# Patient Record
Sex: Female | Born: 1955
Health system: Southern US, Community
[De-identification: ages and names within clinical notes are randomized; demographics above are authoritative.]

## PROBLEM LIST (undated history)

## (undated) DIAGNOSIS — B019 Varicella without complication: Secondary | ICD-10-CM

## (undated) DIAGNOSIS — F329 Major depressive disorder, single episode, unspecified: Secondary | ICD-10-CM

## (undated) DIAGNOSIS — R410 Disorientation, unspecified: Secondary | ICD-10-CM

## (undated) DIAGNOSIS — F32A Depression, unspecified: Secondary | ICD-10-CM

## (undated) HISTORY — DX: Varicella without complication: B01.9

## (undated) HISTORY — DX: Depression, unspecified: F32.A

## (undated) HISTORY — DX: Disorientation, unspecified: R41.0

## (undated) HISTORY — PX: OTHER SURGICAL HISTORY: SHX169

## (undated) HISTORY — DX: Major depressive disorder, single episode, unspecified: F32.9

---

## 2013-11-03 ENCOUNTER — Ambulatory Visit: Payer: Self-pay | Admitting: Physician Assistant

## 2013-11-06 ENCOUNTER — Other Ambulatory Visit: Payer: Self-pay | Admitting: Physician Assistant

## 2013-11-06 ENCOUNTER — Encounter: Payer: Self-pay | Admitting: Physician Assistant

## 2013-11-06 ENCOUNTER — Ambulatory Visit (INDEPENDENT_AMBULATORY_CARE_PROVIDER_SITE_OTHER): Payer: TRICARE For Life (TFL) | Admitting: Physician Assistant

## 2013-11-06 VITALS — BP 110/72 | HR 66 | Temp 97.9°F | Resp 14 | Ht 60.0 in | Wt 101.0 lb

## 2013-11-06 DIAGNOSIS — F0390 Unspecified dementia without behavioral disturbance: Secondary | ICD-10-CM

## 2013-11-06 DIAGNOSIS — F329 Major depressive disorder, single episode, unspecified: Secondary | ICD-10-CM

## 2013-11-06 DIAGNOSIS — F32A Depression, unspecified: Secondary | ICD-10-CM

## 2013-11-06 DIAGNOSIS — F3289 Other specified depressive episodes: Secondary | ICD-10-CM

## 2013-11-06 LAB — CBC
HEMATOCRIT: 37.4 % (ref 36.0–46.0)
HEMOGLOBIN: 12.6 g/dL (ref 12.0–15.0)
MCH: 31.1 pg (ref 26.0–34.0)
MCHC: 33.7 g/dL (ref 30.0–36.0)
MCV: 92.3 fL (ref 78.0–100.0)
Platelets: 254 10*3/uL (ref 150–400)
RBC: 4.05 MIL/uL (ref 3.87–5.11)
RDW: 12.8 % (ref 11.5–15.5)
WBC: 4.4 10*3/uL (ref 4.0–10.5)

## 2013-11-06 LAB — SEDIMENTATION RATE: Sed Rate: 9 mm/hr (ref 0–22)

## 2013-11-06 MED ORDER — ESCITALOPRAM OXALATE 20 MG PO TABS: ORAL_TABLET | ORAL | Status: DC

## 2013-11-06 NOTE — Progress Notes (Signed)
Pre visit review using our clinic review tool, if applicable. No additional management support is needed unless otherwise documented below in the visit note/SLS  
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
## 2013-11-06 NOTE — Patient Instructions (Signed)
It is unclear where your confusion is stemming from.  Please obtain labs.  Then stop by the front desk to have MRI scheduled.  You will also be contacted by a neurologist for evaluation.  I do think depression/anxiety is contributing to symptoms, but I do not feel it is the sole cause.  YOU ARE NOT TO DRIVE UNTIL CLEARED BY NEUROLOGY.  I will call you with all of your labs/imaging results. Please take Lexapro 10 mg (1/2 tablet) daily for 2 weeks, then increase to 20 mg daily (1 tablet)/

## 2013-11-07 LAB — TSH: TSH: 0.701 u[IU]/mL (ref 0.350–4.500)

## 2013-11-07 LAB — URINALYSIS, ROUTINE W REFLEX MICROSCOPIC
BILIRUBIN URINE: NEGATIVE
GLUCOSE, UA: NEGATIVE mg/dL
Hgb urine dipstick: NEGATIVE
Ketones, ur: NEGATIVE mg/dL
Leukocytes, UA: NEGATIVE
Nitrite: NEGATIVE
PH: 7.5 (ref 5.0–8.0)
Protein, ur: NEGATIVE mg/dL
SPECIFIC GRAVITY, URINE: 1.014 (ref 1.005–1.030)
Urobilinogen, UA: 1 mg/dL (ref 0.0–1.0)

## 2013-11-07 LAB — DRUG SCREEN, URINE
AMPHETAMINE SCRN UR: NEGATIVE
BENZODIAZEPINES.: NEGATIVE
Barbiturate Quant, Ur: NEGATIVE
Cocaine Metabolites: NEGATIVE
Creatinine,U: 87.89 mg/dL
Marijuana Metabolite: NEGATIVE
Methadone: NEGATIVE
Opiates: NEGATIVE
PHENCYCLIDINE (PCP): NEGATIVE
Propoxyphene: NEGATIVE

## 2013-11-07 LAB — HIV ANTIBODY (ROUTINE TESTING W REFLEX): HIV: NONREACTIVE

## 2013-11-07 LAB — FOLATE: Folate: 16.6 ng/mL

## 2013-11-07 LAB — RPR

## 2013-11-07 LAB — VITAMIN B12: Vitamin B-12: 585 pg/mL (ref 211–911)

## 2013-11-08 ENCOUNTER — Ambulatory Visit (INDEPENDENT_AMBULATORY_CARE_PROVIDER_SITE_OTHER)

## 2013-11-08 DIAGNOSIS — F0390 Unspecified dementia without behavioral disturbance: Secondary | ICD-10-CM

## 2013-11-08 DIAGNOSIS — G319 Degenerative disease of nervous system, unspecified: Secondary | ICD-10-CM

## 2013-11-13 ENCOUNTER — Ambulatory Visit (INDEPENDENT_AMBULATORY_CARE_PROVIDER_SITE_OTHER): Payer: TRICARE For Life (TFL) | Admitting: Neurology

## 2013-11-13 ENCOUNTER — Encounter: Payer: Self-pay | Admitting: Neurology

## 2013-11-13 VITALS — BP 102/68 | HR 66 | Temp 97.6°F | Resp 18 | Ht 61.0 in | Wt 100.8 lb

## 2013-11-13 DIAGNOSIS — F329 Major depressive disorder, single episode, unspecified: Secondary | ICD-10-CM

## 2013-11-13 DIAGNOSIS — F32A Depression, unspecified: Secondary | ICD-10-CM

## 2013-11-13 DIAGNOSIS — G309 Alzheimer's disease, unspecified: Principal | ICD-10-CM

## 2013-11-13 DIAGNOSIS — F0280 Dementia in other diseases classified elsewhere without behavioral disturbance: Secondary | ICD-10-CM

## 2013-11-13 DIAGNOSIS — F3289 Other specified depressive episodes: Secondary | ICD-10-CM

## 2013-11-13 MED ORDER — MEMANTINE HCL ER 7 & 14 & 21 &28 MG PO CP24: ORAL_CAPSULE | ORAL | Status: DC

## 2013-11-13 NOTE — Patient Instructions (Addendum)
1.  We will start memantine ER (Namenda XR) starter pack. Side effects include dizziness, headache, diarrhea or constipation.  Call with any questions or concerns. 2.  We will refer you to formal neuropsychological testing 3. Recommend following a set routine schedule (in regards to bedtime, waking up and meals). 4.  Stay active and social.  Go for walks with family members. 5.  We will provide you information with support groups and websites. 6.  Follow up in 6 months.
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
## 2013-11-14 DIAGNOSIS — F32A Depression, unspecified: Secondary | ICD-10-CM | POA: Insufficient documentation

## 2013-11-14 DIAGNOSIS — F0390 Unspecified dementia without behavioral disturbance: Secondary | ICD-10-CM | POA: Insufficient documentation

## 2013-11-14 DIAGNOSIS — F329 Major depressive disorder, single episode, unspecified: Secondary | ICD-10-CM | POA: Insufficient documentation

## 2013-11-14 NOTE — Assessment & Plan Note (Signed)
Will obtain Dementia Panel.  Will obtain MRI Brain w/o contrast. No driving.  Urgent referral placed to Neurology.

## 2013-11-14 NOTE — Assessment & Plan Note (Addendum)
Will start Lexapro 20 mg tablet -- 1/2 tablet daily x 2 weeks.  Then increase to 1 tablet daily. Follow-up in 1 month.

## 2013-11-16 ENCOUNTER — Telehealth: Payer: Self-pay | Admitting: *Deleted

## 2013-11-16 NOTE — Telephone Encounter (Signed)
Called from the pharmacy the titrated pack of Memantine is not available.

## 2013-11-20 ENCOUNTER — Telehealth: Payer: Self-pay | Admitting: Neurology

## 2013-11-20 NOTE — Telephone Encounter (Signed)
Pt needs talk someone about rx please call [phone]

## 2013-11-20 NOTE — Telephone Encounter (Signed)
Memantine HCl ER 7 & 14 & 21 &28 MG  1 daily with 1 refill : Start one 7mg  tab qd x7d, then one 14mg  tab qd x7d, then one 21mg  tab qd x7d, then one 28mg  tab qd [facility] [address]  I spoke with Manuela Schwartz to order they are no longer making the starter paks  . [facility] no longer carries the RX this way and was not covered by [employer] . Patient's brother was very upset with me about this stating he was a [employer] scientist and did not like that this was happening

## 2013-12-25 ENCOUNTER — Telehealth: Payer: Self-pay | Admitting: *Deleted

## 2013-12-25 NOTE — Telephone Encounter (Signed)
Adonis Brook from [facility] needs office note faxed [phone] for Ms. Allene Dillon

## 2014-01-10 ENCOUNTER — Telehealth: Payer: Self-pay | Admitting: Neurology

## 2014-01-10 ENCOUNTER — Other Ambulatory Visit: Payer: Self-pay | Admitting: *Deleted

## 2014-01-10 ENCOUNTER — Telehealth: Payer: Self-pay

## 2014-01-10 DIAGNOSIS — F32A Depression, unspecified: Secondary | ICD-10-CM

## 2014-01-10 DIAGNOSIS — F329 Major depressive disorder, single episode, unspecified: Secondary | ICD-10-CM

## 2014-01-10 MED ORDER — MEMANTINE HCL ER 7 & 14 & 21 &28 MG PO CP24: 28.0000 mg | ORAL_CAPSULE | Freq: Every day | ORAL | Status: DC

## 2014-01-10 MED ORDER — ESCITALOPRAM OXALATE 20 MG PO TABS: ORAL_TABLET | ORAL | Status: DC

## 2014-01-10 NOTE — Telephone Encounter (Signed)
Pt needs to have [facility] [address]

## 2014-01-10 NOTE — Telephone Encounter (Signed)
Rx sent to pharmacy. LDM 

## 2014-01-15 ENCOUNTER — Telehealth: Payer: Self-pay | Admitting: Neurology

## 2014-01-15 NOTE — Telephone Encounter (Signed)
Pt's brother came by the office requesting a refill for [facility] 28MG   [facility] [place]  C/B [phone]

## 2014-01-16 ENCOUNTER — Other Ambulatory Visit: Payer: Self-pay | Admitting: Physician Assistant

## 2014-01-16 ENCOUNTER — Other Ambulatory Visit: Payer: Self-pay | Admitting: *Deleted

## 2014-01-16 ENCOUNTER — Telehealth: Payer: Self-pay | Admitting: *Deleted

## 2014-01-16 DIAGNOSIS — F0280 Dementia in other diseases classified elsewhere without behavioral disturbance: Secondary | ICD-10-CM

## 2014-01-16 DIAGNOSIS — G309 Alzheimer's disease, unspecified: Principal | ICD-10-CM

## 2014-01-16 MED ORDER — MEMANTINE HCL-DONEPEZIL HCL ER 28-10 MG PO CP24: 28.0000 mg | ORAL_CAPSULE | Freq: Every day | ORAL | Status: DC

## 2014-01-16 NOTE — Telephone Encounter (Signed)
Namenda XR 28 mg called  To pharmacy # [id number] with 2 refills

## 2014-01-16 NOTE — Telephone Encounter (Signed)
Error

## 2014-01-23 ENCOUNTER — Other Ambulatory Visit: Payer: Self-pay | Admitting: *Deleted

## 2014-01-24 MED ORDER — MEMANTINE HCL ER 28 MG PO CP24: 28.0000 mg | ORAL_CAPSULE | Freq: Every day | ORAL | Status: DC

## 2014-03-01 ENCOUNTER — Telehealth: Payer: Self-pay | Admitting: Neurology

## 2014-03-01 NOTE — Telephone Encounter (Signed)
Requests for records received via mail from [facility]. Request forwarded to HIM at [facility] via interoffice mail for processing / Sherri S.

## 2014-03-06 ENCOUNTER — Telehealth: Payer: Self-pay | Admitting: *Deleted

## 2014-03-06 NOTE — Telephone Encounter (Signed)
Received medical record request via mail from [facility]. Forms forwarded to [facility]. JG//CMA

## 2014-03-16 ENCOUNTER — Telehealth: Payer: Self-pay | Admitting: Neurology

## 2014-03-16 NOTE — Telephone Encounter (Signed)
Received request by mail from [facility] requesting medical records. Request sent to HIM at [facility] via interoffice mail for processing / Sherri S.

## 2014-03-20 ENCOUNTER — Telehealth: Payer: Self-pay | Admitting: Physician Assistant

## 2014-03-20 MED ORDER — ESCITALOPRAM OXALATE 20 MG PO TABS: ORAL_TABLET | ORAL | Status: DC

## 2014-03-20 NOTE — Telephone Encounter (Signed)
Pt called and scheduled an appt for follow up

## 2014-03-20 NOTE — Telephone Encounter (Signed)
Ok to refill one month supply. She needs follow-up before additional refills will be given and patient was due for follow-up in August.

## 2014-03-20 NOTE — Telephone Encounter (Signed)
Left message for patient to return my call.

## 2014-03-20 NOTE — Telephone Encounter (Signed)
Caller name: Eather Colas Relation to pt: brother and POA Call back number: [phone] [facility] [address]: [phone]  Reason for call:   Patient brother states that patient is no longer using [facility] and would like a refill of escitalopram [facility] [address].

## 2014-03-20 NOTE — Telephone Encounter (Signed)
Please call pt and schedule follow-up appointment for further refills per provider instructions/SLS Thanks.

## 2014-03-26 ENCOUNTER — Ambulatory Visit (INDEPENDENT_AMBULATORY_CARE_PROVIDER_SITE_OTHER): Admitting: Physician Assistant

## 2014-03-26 ENCOUNTER — Encounter: Payer: Self-pay | Admitting: Physician Assistant

## 2014-03-26 VITALS — BP 128/81 | HR 68 | Temp 98.5°F | Resp 16 | Ht 61.0 in | Wt 114.0 lb

## 2014-03-26 DIAGNOSIS — F329 Major depressive disorder, single episode, unspecified: Secondary | ICD-10-CM

## 2014-03-26 DIAGNOSIS — F32A Depression, unspecified: Secondary | ICD-10-CM

## 2014-03-26 MED ORDER — ESCITALOPRAM OXALATE 20 MG PO TABS: ORAL_TABLET | ORAL | Status: DC

## 2014-03-26 NOTE — Assessment & Plan Note (Signed)
Doing well with current regimen.  No SI/HI.  Will continue current regimen.  Medications refilled. 90-day supply given with one refill.  Follow-up in 6 months.  Patient to continue follow-up with Neuropsychologist as well.  Return sooner if needed.  Recommended we perform CPE at time of follow-up.

## 2014-03-26 NOTE — Progress Notes (Signed)
    Patient presents to clinic today for follow-up of depression secondary to her dementia.  Is currently on Lexapro 20 mg daily.  Endorses taking medications as directed.  Patient and her brother both endorse improvement in mood with medication.  Denies depressed mood or anhedonia.  Endorses restful sleep as well with medication.  Is prescribed Namenda by her Neurologist with good improvement in cognition.  Has follow-up every 3 months at present.  Is also seeing a Neuropsychologist.   Past Medical History  Diagnosis Date  . Depression   . Acute confusion   . Chicken pox     Current Outpatient Prescriptions on File Prior to Visit  Medication Sig Dispense Refill  . Memantine HCl ER (NAMENDA XR) 28 MG CP24 Take 28 mg by mouth daily. 30 capsule 0   No current facility-administered medications on file prior to visit.    No Known Allergies  Family History  Problem Relation Age of Onset  . Asthma Sister   . Diabetes Father     Deceased  . Cancer Father 71    Multiple Myeloma  . Heart defect Sister   . Heart defect Mother     Deceased  . Heart defect Brother   . Migraines Other     Familial  . Heart attack Sister   . Healthy Brother     x4  . Healthy Sister     x2  . Alzheimer's disease Maternal Grandfather     Dementia  . Liver disease Maternal Uncle   . Healthy Son     x1    History   Social History  . Marital Status: Widowed    Spouse Name: N/A    Number of Children: N/A  . Years of Education: N/A   Social History Main Topics  . Smoking status: Never Smoker   . Smokeless tobacco: Never Used  . Alcohol Use: Yes     Comment: rare  . Drug Use: No  . Sexual Activity: No   Other Topics Concern  . None   Social History Narrative    Review of Systems - See HPI.  All other ROS are negative.  BP 128/81 mmHg  Pulse 68  Temp(Src) 98.5 F (36.9 C) (Oral)  Resp 16  Ht 5' 1" (1.549 m)  Wt 114 lb (51.71 kg)  BMI 21.55 kg/m2  SpO2 99%  Physical Exam    Constitutional: She is oriented to person, place, and time and well-developed, well-nourished, and in no distress.  HENT:  Head: Normocephalic and atraumatic.  Eyes: Conjunctivae are normal.  Neck: Neck supple.  Cardiovascular: Normal rate, regular rhythm, normal heart sounds and intact distal pulses.   Pulmonary/Chest: Effort normal and breath sounds normal. No respiratory distress. She has no wheezes. She has no rales. She exhibits no tenderness.  Neurological: She is alert and oriented to person, place, and time.  Skin: Skin is warm and dry. No rash noted.  Psychiatric: Affect normal.  Vitals reviewed.  Assessment/Plan: Depression Doing well with current regimen.  No SI/HI.  Will continue current regimen.  Medications refilled. 90-day supply given with one refill.  Follow-up in 6 months.  Patient to continue follow-up with Neuropsychologist as well.  Return sooner if needed.  Recommended we perform CPE at time of follow-up.      

## 2014-03-26 NOTE — Progress Notes (Signed)
Pre visit review using our clinic review tool, if applicable. No additional management support is needed unless otherwise documented below in the visit note/SLS  

## 2014-03-26 NOTE — Patient Instructions (Signed)
I am glad you are doing well. Please continue the Lexapro as directed daily.  Continue follow-up with Neurology as scheduled.  I want to see you in 6 months.  I recommend we do a physical at that time.  Return sooner as needed.  These intermittent headaches you have sound like migraines.  Read the information below on measures to take to help rid yourself of these.   Migraine Headache A migraine headache is very bad, throbbing pain on one or both sides of your head. Talk to your doctor about what things may bring on (trigger) your migraine headaches. HOME CARE  Only take medicines as told by your doctor.  Lie down in a dark, quiet room when you have a migraine.  Keep a journal to find out if certain things bring on migraine headaches. For example, write down:  What you eat and drink.  How much sleep you get.  Any change to your diet or medicines.  Lessen how much alcohol you drink.  Quit smoking if you smoke.  Get enough sleep.  Lessen any stress in your life.  Keep lights dim if bright lights bother you or make your migraines worse. GET HELP RIGHT AWAY IF:   Your migraine becomes really bad.  You have a fever.  You have a stiff neck.  You have trouble seeing.  Your muscles are weak, or you lose muscle control.  You lose your balance or have trouble walking.  You feel like you will pass out (faint), or you pass out.  You have really bad symptoms that are different than your first symptoms. MAKE SURE YOU:   Understand these instructions.  Will watch your condition.  Will get help right away if you are not doing well or get worse. Document Released: 02/11/2008 Document Revised: 07/27/2011 Document Reviewed: 01/09/2013 [facility] Patient Information 2015 [place], [place]. This information is not intended to replace advice given to you by your health care provider. Make sure you discuss any questions you have with your health care provider.

## 2014-04-27 ENCOUNTER — Ambulatory Visit: Payer: TRICARE For Life (TFL) | Admitting: Neurology

## 2014-05-21 ENCOUNTER — Other Ambulatory Visit: Payer: Self-pay | Admitting: Neurology

## 2014-05-28 ENCOUNTER — Encounter: Payer: Self-pay | Admitting: Neurology

## 2014-05-28 ENCOUNTER — Ambulatory Visit (INDEPENDENT_AMBULATORY_CARE_PROVIDER_SITE_OTHER): Admitting: Neurology

## 2014-05-28 VITALS — BP 106/68 | HR 78 | Temp 98.4°F | Resp 16 | Ht 61.0 in | Wt 118.6 lb

## 2014-05-28 DIAGNOSIS — R202 Paresthesia of skin: Secondary | ICD-10-CM

## 2014-05-28 DIAGNOSIS — R200 Anesthesia of skin: Secondary | ICD-10-CM

## 2014-05-28 DIAGNOSIS — G300 Alzheimer's disease with early onset: Secondary | ICD-10-CM

## 2014-05-28 DIAGNOSIS — F0280 Dementia in other diseases classified elsewhere without behavioral disturbance: Secondary | ICD-10-CM | POA: Insufficient documentation

## 2014-05-28 LAB — VITAMIN B12: Vitamin B-12: 743 pg/mL (ref 211–911)

## 2014-05-28 LAB — TSH: TSH: 0.828 u[IU]/mL (ref 0.350–4.500)

## 2014-05-28 NOTE — Patient Instructions (Signed)
1.  Continue memantine ER 28mg  daily 2.  To evaluate the numbness, we will get a nerve conduction study to look for neuropathy.   3.  We will also check B12 and TSH. 4.  We will refer you to the [facility] at [place].  Follow up after nerve conduction study.
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
## 2014-05-29 ENCOUNTER — Telehealth: Payer: Self-pay | Admitting: *Deleted

## 2014-05-29 NOTE — Telephone Encounter (Signed)
patient has an appt with Dr Brigitte Pulse at  [facility] 06/21/14 3:15 brother is aware

## 2014-06-12 ENCOUNTER — Telehealth: Payer: Self-pay | Admitting: Neurology

## 2014-06-12 NOTE — Telephone Encounter (Signed)
I have left several messages for patient for EMG since 05-29-14 and can not get anyone to call me back so at this time she does not have a appt for EMG or Follow up with you

## 2014-08-22 ENCOUNTER — Other Ambulatory Visit: Payer: Self-pay | Admitting: Physician Assistant

## 2014-09-04 ENCOUNTER — Telehealth: Payer: Self-pay | Admitting: Physician Assistant

## 2014-09-04 NOTE — Telephone Encounter (Signed)
Pre Visit letter sent  °

## 2014-09-20 ENCOUNTER — Other Ambulatory Visit: Payer: Self-pay | Admitting: Neurology

## 2014-09-20 NOTE — Telephone Encounter (Signed)
Rx sent 

## 2014-09-25 ENCOUNTER — Telehealth: Payer: Self-pay | Admitting: *Deleted

## 2014-09-25 NOTE — Telephone Encounter (Signed)
Unable to reach patient at time of Pre-Visit Call.  Left message for patient to return call when available.    

## 2014-09-26 ENCOUNTER — Encounter: Payer: Self-pay | Admitting: Physician Assistant

## 2014-09-26 ENCOUNTER — Ambulatory Visit (INDEPENDENT_AMBULATORY_CARE_PROVIDER_SITE_OTHER): Admitting: Physician Assistant

## 2014-09-26 ENCOUNTER — Other Ambulatory Visit (INDEPENDENT_AMBULATORY_CARE_PROVIDER_SITE_OTHER)

## 2014-09-26 ENCOUNTER — Other Ambulatory Visit (HOSPITAL_COMMUNITY)
Admission: RE | Admit: 2014-09-26 | Discharge: 2014-09-26 | Disposition: A | Discharge status: Home / Self Care | Source: Ambulatory Visit | Attending: Family Medicine | Admitting: Family Medicine

## 2014-09-26 VITALS — BP 122/82 | HR 69 | Temp 98.0°F | Wt 114.0 lb

## 2014-09-26 DIAGNOSIS — G300 Alzheimer's disease with early onset: Secondary | ICD-10-CM

## 2014-09-26 DIAGNOSIS — Z124 Encounter for screening for malignant neoplasm of cervix: Secondary | ICD-10-CM | POA: Diagnosis not present

## 2014-09-26 DIAGNOSIS — Z0000 Encounter for general adult medical examination without abnormal findings: Secondary | ICD-10-CM | POA: Diagnosis not present

## 2014-09-26 DIAGNOSIS — Z01419 Encounter for gynecological examination (general) (routine) without abnormal findings: Secondary | ICD-10-CM | POA: Insufficient documentation

## 2014-09-26 DIAGNOSIS — F0280 Dementia in other diseases classified elsewhere without behavioral disturbance: Secondary | ICD-10-CM

## 2014-09-26 DIAGNOSIS — L609 Nail disorder, unspecified: Secondary | ICD-10-CM

## 2014-09-26 DIAGNOSIS — Z1239 Encounter for other screening for malignant neoplasm of breast: Secondary | ICD-10-CM | POA: Diagnosis not present

## 2014-09-26 DIAGNOSIS — R937 Abnormal findings on diagnostic imaging of other parts of musculoskeletal system: Secondary | ICD-10-CM

## 2014-09-26 LAB — URINALYSIS, ROUTINE W REFLEX MICROSCOPIC
BILIRUBIN URINE: NEGATIVE
Hgb urine dipstick: NEGATIVE
Ketones, ur: NEGATIVE
LEUKOCYTES UA: NEGATIVE
Nitrite: NEGATIVE
PH: 7.5 (ref 5.0–8.0)
RBC / HPF: NONE SEEN (ref 0–?)
Specific Gravity, Urine: 1.015 (ref 1.000–1.030)
Total Protein, Urine: NEGATIVE
URINE GLUCOSE: NEGATIVE
Urobilinogen, UA: 1 (ref 0.0–1.0)

## 2014-09-26 LAB — HEMOGLOBIN A1C: HEMOGLOBIN A1C: 5 % (ref 4.6–6.5)

## 2014-09-26 LAB — BASIC METABOLIC PANEL
BUN: 13 mg/dL (ref 6–23)
CALCIUM: 10.1 mg/dL (ref 8.4–10.5)
CO2: 32 mEq/L (ref 19–32)
Chloride: 100 mEq/L (ref 96–112)
Creatinine, Ser: 0.59 mg/dL (ref 0.40–1.20)
GFR: 111.09 mL/min (ref >60.00)
Glucose, Bld: 105 mg/dL — ABNORMAL HIGH (ref 70–99)
Potassium: 3.3 mEq/L — ABNORMAL LOW (ref 3.5–5.1)
SODIUM: 137 meq/L (ref 135–145)

## 2014-09-26 LAB — HEPATIC FUNCTION PANEL
ALK PHOS: 63 U/L (ref 39–117)
ALT: 25 U/L (ref 0–35)
AST: 21 U/L (ref 0–37)
Albumin: 4.5 g/dL (ref 3.5–5.2)
Bilirubin, Direct: 0.1 mg/dL (ref 0.0–0.3)
Total Bilirubin: 0.9 mg/dL (ref 0.2–1.2)
Total Protein: 8.1 g/dL (ref 6.0–8.3)

## 2014-09-26 LAB — LIPID PANEL
CHOLESTEROL: 269 mg/dL — AB (ref 0–200)
HDL: 52.2 mg/dL (ref >39.00)
LDL Cholesterol: 191 mg/dL — ABNORMAL HIGH (ref 0–99)
NonHDL: 216.8
Total CHOL/HDL Ratio: 5
Triglycerides: 129 mg/dL (ref 0.0–149.0)
VLDL: 25.8 mg/dL (ref 0.0–40.0)

## 2014-09-26 LAB — CBC
HCT: 40.2 % (ref 36.0–46.0)
Hemoglobin: 13.5 g/dL (ref 12.0–15.0)
MCHC: 33.5 g/dL (ref 30.0–36.0)
MCV: 91 fl (ref 78.0–100.0)
PLATELETS: 241 10*3/uL (ref 150.0–400.0)
RBC: 4.41 Mil/uL (ref 3.87–5.11)
RDW: 12.7 % (ref 11.5–15.5)
WBC: 4.7 10*3/uL (ref 4.0–10.5)

## 2014-09-26 LAB — VITAMIN B12: Vitamin B-12: 463 pg/mL (ref 211–911)

## 2014-09-26 LAB — TSH: TSH: 0.62 u[IU]/mL (ref 0.35–4.50)

## 2014-09-26 NOTE — Progress Notes (Signed)
Pre visit review using our clinic review tool, if applicable. No additional management support is needed unless otherwise documented below in the visit note. 

## 2014-09-26 NOTE — Assessment & Plan Note (Addendum)
Order placed for screening bilateral mammogram.

## 2014-09-26 NOTE — Assessment & Plan Note (Signed)
Preventative care discussed. Patient overdue for mammogram, colonoscopy and Pap smear. Pap performed today. Order placed for mammogram. Will place referral to GI for screening colonoscopy. Will obtain fasting labs today to include CBC, BMP, LFTs, TSH, UA, lipid panel. Handout given to patient in the AVS.

## 2014-09-26 NOTE — Progress Notes (Signed)
Patient presents to clinic today for annual exam.  Patient is fasting for labs.  Acute Concerns: Patient requesting evaluation of her toes. Endorses discoloration of toenails over the past several months. Patient does endorse history of onychomycosis.  Chronic Issues: Alzheimer's Dementia -- patient currently followed by neurology. Is on Aricept and Namenda daily. Endorses stability and cognition. States she is remembering things much better. Brother is with the patient today. He denies any acute concerns regarding dementia.   Depression -- Mood doing well overall. Patient endorses taking her Lexapro daily as directed without side effect. Denies suicidal thought or ideation. Endorses being very happy.  Health Maintenance: Dental -- Dentures Vision -- Up-to-date Immunizations -- Due.  Will be getting today. Colonoscopy -- Has never had. Average risk. Will place referral.  Mammogram -- Overdue. No hx of abnormal mammogram. PAP -- Overdue. No hx of abnormal PAP per patient.  Is not currently followed by Gynecology.  Past Medical History  Diagnosis Date  . Depression   . Acute confusion   . Chicken pox     Past Surgical History  Procedure Laterality Date  . Unremarkable.      Current Outpatient Prescriptions on File Prior to Visit  Medication Sig Dispense Refill  . Cinnamon 500 MG capsule Take 500 mg by mouth daily.    Marland Kitchen escitalopram (LEXAPRO) 20 MG tablet TAKE 1 TABLET BY MOUTH EVERY DAY 90 tablet 0  . Multiple Vitamin (MULTIVITAMIN) tablet Take 1 tablet by mouth daily.    Marland Kitchen NAMENDA XR 28 MG CP24 24 hr capsule TAKE ONE CAPSULE BY MOUTH EVERY DAY 30 capsule 3   No current facility-administered medications on file prior to visit.    No Known Allergies  Family History  Problem Relation Age of Onset  . Asthma Sister   . Diabetes Father     Deceased  . Cancer Father 44    Multiple Myeloma  . Heart defect Sister   . Heart defect Mother     Deceased  . Heart defect  Brother   . Migraines Other     Familial  . Heart attack Sister   . Healthy Brother     x4  . Healthy Sister     x2  . Alzheimer's disease Maternal Grandfather     Dementia  . Liver disease Maternal Uncle   . Healthy Son     x1    History   Social History  . Marital Status: Widowed    Spouse Name: N/A  . Number of Children: N/A  . Years of Education: N/A   Occupational History  . Not on file.   Social History Main Topics  . Smoking status: Never Smoker   . Smokeless tobacco: Never Used  . Alcohol Use: Yes     Comment: rare  . Drug Use: No  . Sexual Activity: No   Other Topics Concern  . Not on file   Social History Narrative   Review of Systems  Constitutional: Negative for fever and weight loss.  HENT: Negative for ear discharge, ear pain, hearing loss and tinnitus.   Eyes: Negative for blurred vision, double vision, photophobia and pain.  Respiratory: Negative for cough and shortness of breath.   Cardiovascular: Negative for chest pain and palpitations.  Gastrointestinal: Negative for heartburn, nausea, vomiting, abdominal pain, diarrhea, constipation, blood in stool and melena.  Genitourinary: Negative for dysuria, urgency, frequency, hematuria and flank pain.  Musculoskeletal: Negative for falls.  Neurological: Negative for dizziness, loss  of consciousness and headaches.  Endo/Heme/Allergies: Negative for environmental allergies.  Psychiatric/Behavioral: Negative for depression, suicidal ideas, hallucinations and substance abuse. The patient is not nervous/anxious and does not have insomnia.    BP 122/82 mmHg  Pulse 69  Temp(Src) 98 F (36.7 C)  Wt 114 lb (51.71 kg)  SpO2 98%  Physical Exam  Constitutional: She is oriented to person, place, and time and well-developed, well-nourished, and in no distress.  HENT:  Head: Normocephalic and atraumatic.  Right Ear: Tympanic membrane, external ear and ear canal normal.  Left Ear: Tympanic membrane,  external ear and ear canal normal.  Nose: Nose normal. No mucosal edema.  Mouth/Throat: Uvula is midline, oropharynx is clear and moist and mucous membranes are normal. No oropharyngeal exudate or posterior oropharyngeal erythema.  Eyes: Conjunctivae are normal. Pupils are equal, round, and reactive to light.  Neck: Neck supple. No thyromegaly present.  Cardiovascular: Normal rate, regular rhythm, normal heart sounds and intact distal pulses.   Pulmonary/Chest: Effort normal and breath sounds normal. No respiratory distress. She has no wheezes. She has no rales.  Abdominal: Soft. Bowel sounds are normal. She exhibits no distension and no mass. There is no tenderness. There is no rebound and no guarding.  Genitourinary: Vagina normal, uterus normal, cervix normal, right adnexa normal and left adnexa normal. No vaginal discharge found.  Musculoskeletal: Normal range of motion.  Lymphadenopathy:    She has no cervical adenopathy.  Neurological: She is alert and oriented to person, place, and time. No cranial nerve deficit.  Skin: Skin is warm and dry. No rash noted.  Psychiatric: Affect normal.  Vitals reviewed.  No results found for this or any previous visit (from the past 2160 hour(s)).  Assessment/Plan: Pap smear for cervical cancer screening Examination within normal limits. Pap obtained and sent to lab for cytology.   Breast cancer screening Order placed for screening bilateral mammogram.   Alzheimer's disease, early onset Followed by neurology. Well-controlled. Continue current regimen. Will check B12 level, as low B12 can exacerbate memory issues   Visit for preventive health examination Preventative care discussed. Patient overdue for mammogram, colonoscopy and Pap smear. Pap performed today. Order placed for mammogram. Will place referral to GI for screening colonoscopy. Will obtain fasting labs today to include CBC, BMP, LFTs, TSH, UA, lipid panel. Handout given to patient in  the AVS.

## 2014-09-26 NOTE — Assessment & Plan Note (Signed)
Followed by neurology. Well-controlled. Continue current regimen. Will check B12 level, as low B12 can exacerbate memory issues

## 2014-09-26 NOTE — Patient Instructions (Signed)
Please go to the lab for blood work. I will call you with your results.  You will be contacted to schedule a mammogram. You will also be contacted by Podiatry for an appointment. Follow-up with Neurology as scheduled.  Preventive Care for Adults A healthy lifestyle and preventive care can promote health and wellness. Preventive health guidelines for women include the following key practices.  A routine yearly physical is a good way to check with your health care provider about your health and preventive screening. It is a chance to share any concerns and updates on your health and to receive a thorough exam.  Visit your dentist for a routine exam and preventive care every 6 months. Brush your teeth twice a day and floss once a day. Good oral hygiene prevents tooth decay and gum disease.  The frequency of eye exams is based on your age, health, family medical history, use of contact lenses, and other factors. Follow your health care provider's recommendations for frequency of eye exams.  Eat a healthy diet. Foods like vegetables, fruits, whole grains, low-fat dairy products, and lean protein foods contain the nutrients you need without too many calories. Decrease your intake of foods high in solid fats, added sugars, and salt. Eat the right amount of calories for you.Get information about a proper diet from your health care provider, if necessary.  Regular physical exercise is one of the most important things you can do for your health. Most adults should get at least 150 minutes of moderate-intensity exercise (any activity that increases your heart rate and causes you to sweat) each week. In addition, most adults need muscle-strengthening exercises on 2 or more days a week.  Maintain a healthy weight. The body mass index (BMI) is a screening tool to identify possible weight problems. It provides an estimate of body fat based on height and weight. Your health care provider can find your BMI and  can help you achieve or maintain a healthy weight.For adults 20 years and older:  A BMI below 18.5 is considered underweight.  A BMI of 18.5 to 24.9 is normal.  A BMI of 25 to 29.9 is considered overweight.  A BMI of 30 and above is considered obese.  Maintain normal blood lipids and cholesterol levels by exercising and minimizing your intake of saturated fat. Eat a balanced diet with plenty of fruit and vegetables. Blood tests for lipids and cholesterol should begin at age 53 and be repeated every 5 years. If your lipid or cholesterol levels are high, you are over 50, or you are at high risk for heart disease, you may need your cholesterol levels checked more frequently.Ongoing high lipid and cholesterol levels should be treated with medicines if diet and exercise are not working.  If you smoke, find out from your health care provider how to quit. If you do not use tobacco, do not start.  Lung cancer screening is recommended for adults aged 109-80 years who are at high risk for developing lung cancer because of a history of smoking. A yearly low-dose CT scan of the lungs is recommended for people who have at least a 30-pack-year history of smoking and are a current smoker or have quit within the past 15 years. A pack year of smoking is smoking an average of 1 pack of cigarettes a day for 1 year (for example: 1 pack a day for 30 years or 2 packs a day for 15 years). Yearly screening should continue until the smoker has stopped  smoking for at least 15 years. Yearly screening should be stopped for people who develop a health problem that would prevent them from having lung cancer treatment.  If you are pregnant, do not drink alcohol. If you are breastfeeding, be very cautious about drinking alcohol. If you are not pregnant and choose to drink alcohol, do not have more than 1 drink per day. One drink is considered to be 12 ounces (355 mL) of beer, 5 ounces (148 mL) of wine, or 1.5 ounces (44 mL) of  liquor.  Avoid use of street drugs. Do not share needles with anyone. Ask for help if you need support or instructions about stopping the use of drugs.  High blood pressure causes heart disease and increases the risk of stroke. Your blood pressure should be checked at least every 1 to 2 years. Ongoing high blood pressure should be treated with medicines if weight loss and exercise do not work.  If you are 41-19 years old, ask your health care provider if you should take aspirin to prevent strokes.  Diabetes screening involves taking a blood sample to check your fasting blood sugar level. This should be done once every 3 years, after age 54, if you are within normal weight and without risk factors for diabetes. Testing should be considered at a younger age or be carried out more frequently if you are overweight and have at least 1 risk factor for diabetes.  Breast cancer screening is essential preventive care for women. You should practice "breast self-awareness." This means understanding the normal appearance and feel of your breasts and may include breast self-examination. Any changes detected, no matter how small, should be reported to a health care provider. Women in their 77s and 30s should have a clinical breast exam (CBE) by a health care provider as part of a regular health exam every 1 to 3 years. After age 16, women should have a CBE every year. Starting at age 69, women should consider having a mammogram (breast X-ray test) every year. Women who have a family history of breast cancer should talk to their health care provider about genetic screening. Women at a high risk of breast cancer should talk to their health care providers about having an MRI and a mammogram every year.  Breast cancer gene (BRCA)-related cancer risk assessment is recommended for women who have family members with BRCA-related cancers. BRCA-related cancers include breast, ovarian, tubal, and peritoneal cancers. Having  family members with these cancers may be associated with an increased risk for harmful changes (mutations) in the breast cancer genes BRCA1 and BRCA2. Results of the assessment will determine the need for genetic counseling and BRCA1 and BRCA2 testing.  Routine pelvic exams to screen for cancer are no longer recommended for nonpregnant women who are considered low risk for cancer of the pelvic organs (ovaries, uterus, and vagina) and who do not have symptoms. Ask your health care provider if a screening pelvic exam is right for you.  If you have had past treatment for cervical cancer or a condition that could lead to cancer, you need Pap tests and screening for cancer for at least 20 years after your treatment. If Pap tests have been discontinued, your risk factors (such as having a new sexual partner) need to be reassessed to determine if screening should be resumed. Some women have medical problems that increase the chance of getting cervical cancer. In these cases, your health care provider may recommend more frequent screening and Pap tests.  The HPV test is an additional test that may be used for cervical cancer screening. The HPV test looks for the virus that can cause the cell changes on the cervix. The cells collected during the Pap test can be tested for HPV. The HPV test could be used to screen women aged 71 years and older, and should be used in women of any age who have unclear Pap test results. After the age of 62, women should have HPV testing at the same frequency as a Pap test.  Colorectal cancer can be detected and often prevented. Most routine colorectal cancer screening begins at the age of 4 years and continues through age 6 years. However, your health care provider may recommend screening at an earlier age if you have risk factors for colon cancer. On a yearly basis, your health care provider may provide home test kits to check for hidden blood in the stool. Use of a small camera at  the end of a tube, to directly examine the colon (sigmoidoscopy or colonoscopy), can detect the earliest forms of colorectal cancer. Talk to your health care provider about this at age 32, when routine screening begins. Direct exam of the colon should be repeated every 5-10 years through age 21 years, unless early forms of pre-cancerous polyps or small growths are found.  People who are at an increased risk for hepatitis B should be screened for this virus. You are considered at high risk for hepatitis B if:  You were born in a country where hepatitis B occurs often. Talk with your health care provider about which countries are considered high risk.  Your parents were born in a high-risk country and you have not received a shot to protect against hepatitis B (hepatitis B vaccine).  You have HIV or AIDS.  You use needles to inject street drugs.  You live with, or have sex with, someone who has hepatitis B.  You get hemodialysis treatment.  You take certain medicines for conditions like cancer, organ transplantation, and autoimmune conditions.  Hepatitis C blood testing is recommended for all people born from [birth year] through 1965 and any individual with known risks for hepatitis C.  Practice safe sex. Use condoms and avoid high-risk sexual practices to reduce the spread of sexually transmitted infections (STIs). STIs include gonorrhea, chlamydia, syphilis, trichomonas, herpes, HPV, and human immunodeficiency virus (HIV). Herpes, HIV, and HPV are viral illnesses that have no cure. They can result in disability, cancer, and death.  You should be screened for sexually transmitted illnesses (STIs) including gonorrhea and chlamydia if:  You are sexually active and are younger than 24 years.  You are older than 24 years and your health care provider tells you that you are at risk for this type of infection.  Your sexual activity has changed since you were last screened and you are at an increased  risk for chlamydia or gonorrhea. Ask your health care provider if you are at risk.  If you are at risk of being infected with HIV, it is recommended that you take a prescription medicine daily to prevent HIV infection. This is called preexposure prophylaxis (PrEP). You are considered at risk if:  You are a heterosexual woman, are sexually active, and are at increased risk for HIV infection.  You take drugs by injection.  You are sexually active with a partner who has HIV.  Talk with your health care provider about whether you are at high risk of being infected with HIV. If you  choose to begin PrEP, you should first be tested for HIV. You should then be tested every 3 months for as long as you are taking PrEP.  Osteoporosis is a disease in which the bones lose minerals and strength with aging. This can result in serious bone fractures or breaks. The risk of osteoporosis can be identified using a bone density scan. Women ages 48 years and over and women at risk for fractures or osteoporosis should discuss screening with their health care providers. Ask your health care provider whether you should take a calcium supplement or vitamin D to reduce the rate of osteoporosis.  Menopause can be associated with physical symptoms and risks. Hormone replacement therapy is available to decrease symptoms and risks. You should talk to your health care provider about whether hormone replacement therapy is right for you.  Use sunscreen. Apply sunscreen liberally and repeatedly throughout the day. You should seek shade when your shadow is shorter than you. Protect yourself by wearing long sleeves, pants, a wide-brimmed hat, and sunglasses year round, whenever you are outdoors.  Once a month, do a whole body skin exam, using a mirror to look at the skin on your back. Tell your health care provider of new moles, moles that have irregular borders, moles that are larger than a pencil eraser, or moles that have changed  in shape or color.  Stay current with required vaccines (immunizations).  Influenza vaccine. All adults should be immunized every year.  Tetanus, diphtheria, and acellular pertussis (Td, Tdap) vaccine. Pregnant women should receive 1 dose of Tdap vaccine during each pregnancy. The dose should be obtained regardless of the length of time since the last dose. Immunization is preferred during the 27th-36th week of gestation. An adult who has not previously received Tdap or who does not know her vaccine status should receive 1 dose of Tdap. This initial dose should be followed by tetanus and diphtheria toxoids (Td) booster doses every 10 years. Adults with an unknown or incomplete history of completing a 3-dose immunization series with Td-containing vaccines should begin or complete a primary immunization series including a Tdap dose. Adults should receive a Td booster every 10 years.  Varicella vaccine. An adult without evidence of immunity to varicella should receive 2 doses or a second dose if she has previously received 1 dose. Pregnant females who do not have evidence of immunity should receive the first dose after pregnancy. This first dose should be obtained before leaving the health care facility. The second dose should be obtained 4-8 weeks after the first dose.  Human papillomavirus (HPV) vaccine. Females aged 13-26 years who have not received the vaccine previously should obtain the 3-dose series. The vaccine is not recommended for use in pregnant females. However, pregnancy testing is not needed before receiving a dose. If a female is found to be pregnant after receiving a dose, no treatment is needed. In that [place], the remaining doses should be delayed until after the pregnancy. Immunization is recommended for any person with an immunocompromised condition through the age of 25 years if she did not get any or all doses earlier. During the 3-dose series, the second dose should be obtained 4-8 weeks  after the first dose. The third dose should be obtained 24 weeks after the first dose and 16 weeks after the second dose.  Zoster vaccine. One dose is recommended for adults aged 76 years or older unless certain conditions are present.  Measles, mumps, and rubella (MMR) vaccine. Adults born before  1957 generally are considered immune to measles and mumps. Adults born in [birth year] or later should have 1 or more doses of MMR vaccine unless there is a contraindication to the vaccine or there is laboratory evidence of immunity to each of the three diseases. A routine second dose of MMR vaccine should be obtained at least 28 days after the first dose for students attending postsecondary schools, health care workers, or international travelers. People who received inactivated measles vaccine or an unknown type of measles vaccine during 1963-1967 should receive 2 doses of MMR vaccine. People who received inactivated mumps vaccine or an unknown type of mumps vaccine before 1979 and are at high risk for mumps infection should consider immunization with 2 doses of MMR vaccine. For females of childbearing age, rubella immunity should be determined. If there is no evidence of immunity, females who are not pregnant should be vaccinated. If there is no evidence of immunity, females who are pregnant should delay immunization until after pregnancy. Unvaccinated health care workers born before [birth year] who lack laboratory evidence of measles, mumps, or rubella immunity or laboratory confirmation of disease should consider measles and mumps immunization with 2 doses of MMR vaccine or rubella immunization with 1 dose of MMR vaccine.  Pneumococcal 13-valent conjugate (PCV13) vaccine. When indicated, a person who is uncertain of her immunization history and has no record of immunization should receive the PCV13 vaccine. An adult aged 33 years or older who has certain medical conditions and has not been previously immunized should receive 1  dose of PCV13 vaccine. This PCV13 should be followed with a dose of pneumococcal polysaccharide (PPSV23) vaccine. The PPSV23 vaccine dose should be obtained at least 8 weeks after the dose of PCV13 vaccine. An adult aged 26 years or older who has certain medical conditions and previously received 1 or more doses of PPSV23 vaccine should receive 1 dose of PCV13. The PCV13 vaccine dose should be obtained 1 or more years after the last PPSV23 vaccine dose.  Pneumococcal polysaccharide (PPSV23) vaccine. When PCV13 is also indicated, PCV13 should be obtained first. All adults aged 91 years and older should be immunized. An adult younger than age 51 years who has certain medical conditions should be immunized. Any person who resides in a nursing home or long-term care facility should be immunized. An adult smoker should be immunized. People with an immunocompromised condition and certain other conditions should receive both PCV13 and PPSV23 vaccines. People with human immunodeficiency virus (HIV) infection should be immunized as soon as possible after diagnosis. Immunization during chemotherapy or radiation therapy should be avoided. Routine use of PPSV23 vaccine is not recommended for American Indians, [place] Natives, or people younger than 65 years unless there are medical conditions that require PPSV23 vaccine. When indicated, people who have unknown immunization and have no record of immunization should receive PPSV23 vaccine. One-time revaccination 5 years after the first dose of PPSV23 is recommended for people aged 19-64 years who have chronic kidney failure, nephrotic syndrome, asplenia, or immunocompromised conditions. People who received 1-2 doses of PPSV23 before age 38 years should receive another dose of PPSV23 vaccine at age 47 years or later if at least 5 years have passed since the previous dose. Doses of PPSV23 are not needed for people immunized with PPSV23 at or after age 75 years.  Meningococcal  vaccine. Adults with asplenia or persistent complement component deficiencies should receive 2 doses of quadrivalent meningococcal conjugate (MenACWY-D) vaccine. The doses should be obtained at least 2 months  apart. Microbiologists working with certain meningococcal bacteria, [employer] recruits, people at risk during an outbreak, and people who travel to or live in countries with a high rate of meningitis should be immunized. A first-year college student up through age 13 years who is living in a residence hall should receive a dose if she did not receive a dose on or after her 16th birthday. Adults who have certain high-risk conditions should receive one or more doses of vaccine.  Hepatitis A vaccine. Adults who wish to be protected from this disease, have certain high-risk conditions, work with hepatitis A-infected animals, work in hepatitis A research labs, or travel to or work in countries with a high rate of hepatitis A should be immunized. Adults who were previously unvaccinated and who anticipate close contact with an international adoptee during the first 60 days after arrival in the [place] from a country with a high rate of hepatitis A should be immunized.  Hepatitis B vaccine. Adults who wish to be protected from this disease, have certain high-risk conditions, may be exposed to blood or other infectious body fluids, are household contacts or sex partners of hepatitis B positive people, are clients or workers in certain care facilities, or travel to or work in countries with a high rate of hepatitis B should be immunized.  Haemophilus influenzae type b (Hib) vaccine. A previously unvaccinated person with asplenia or sickle cell disease or having a scheduled splenectomy should receive 1 dose of Hib vaccine. Regardless of previous immunization, a recipient of a hematopoietic stem cell transplant should receive a 3-dose series 6-12 months after her successful transplant. Hib vaccine is not  recommended for adults with HIV infection. Preventive Services / Frequency Ages 45 to 42 years  Blood pressure check.** / Every 1 to 2 years.  Lipid and cholesterol check.** / Every 5 years beginning at age 54.  Clinical breast exam.** / Every 3 years for women in their 22s and 106s.  BRCA-related cancer risk assessment.** / For women who have family members with a BRCA-related cancer (breast, ovarian, tubal, or peritoneal cancers).  Pap test.** / Every 2 years from ages 55 through 21. Every 3 years starting at age 39 through age 33 or 19 with a history of 3 consecutive normal Pap tests.  HPV screening.** / Every 3 years from ages 75 through ages 63 to 72 with a history of 3 consecutive normal Pap tests.  Hepatitis C blood test.** / For any individual with known risks for hepatitis C.  Skin self-exam. / Monthly.  Influenza vaccine. / Every year.  Tetanus, diphtheria, and acellular pertussis (Tdap, Td) vaccine.** / Consult your health care provider. Pregnant women should receive 1 dose of Tdap vaccine during each pregnancy. 1 dose of Td every 10 years.  Varicella vaccine.** / Consult your health care provider. Pregnant females who do not have evidence of immunity should receive the first dose after pregnancy.  HPV vaccine. / 3 doses over 6 months, if 34 and younger. The vaccine is not recommended for use in pregnant females. However, pregnancy testing is not needed before receiving a dose.  Measles, mumps, rubella (MMR) vaccine.** / You need at least 1 dose of MMR if you were born in 1957 or later. You may also need a 2nd dose. For females of childbearing age, rubella immunity should be determined. If there is no evidence of immunity, females who are not pregnant should be vaccinated. If there is no evidence of immunity, females who are pregnant should  delay immunization until after pregnancy.  Pneumococcal 13-valent conjugate (PCV13) vaccine.** / Consult your health care  provider.  Pneumococcal polysaccharide (PPSV23) vaccine.** / 1 to 2 doses if you smoke cigarettes or if you have certain conditions.  Meningococcal vaccine.** / 1 dose if you are age 82 to 73 years and a Market researcher living in a residence hall, or have one of several medical conditions, you need to get vaccinated against meningococcal disease. You may also need additional booster doses.  Hepatitis A vaccine.** / Consult your health care provider.  Hepatitis B vaccine.** / Consult your health care provider.  Haemophilus influenzae type b (Hib) vaccine.** / Consult your health care provider. Ages 87 to 29 years  Blood pressure check.** / Every 1 to 2 years.  Lipid and cholesterol check.** / Every 5 years beginning at age 67 years.  Lung cancer screening. / Every year if you are aged 26-80 years and have a 30-pack-year history of smoking and currently smoke or have quit within the past 15 years. Yearly screening is stopped once you have quit smoking for at least 15 years or develop a health problem that would prevent you from having lung cancer treatment.  Clinical breast exam.** / Every year after age 27 years.  BRCA-related cancer risk assessment.** / For women who have family members with a BRCA-related cancer (breast, ovarian, tubal, or peritoneal cancers).  Mammogram.** / Every year beginning at age 67 years and continuing for as long as you are in good health. Consult with your health care provider.  Pap test.** / Every 3 years starting at age 97 years through age 43 or 66 years with a history of 3 consecutive normal Pap tests.  HPV screening.** / Every 3 years from ages 3 years through ages 27 to 63 years with a history of 3 consecutive normal Pap tests.  Fecal occult blood test (FOBT) of stool. / Every year beginning at age 71 years and continuing until age 25 years. You may not need to do this test if you get a colonoscopy every 10 years.  Flexible sigmoidoscopy  or colonoscopy.** / Every 5 years for a flexible sigmoidoscopy or every 10 years for a colonoscopy beginning at age 19 years and continuing until age 63 years.  Hepatitis C blood test.** / For all people born from [birth year] through 1965 and any individual with known risks for hepatitis C.  Skin self-exam. / Monthly.  Influenza vaccine. / Every year.  Tetanus, diphtheria, and acellular pertussis (Tdap/Td) vaccine.** / Consult your health care provider. Pregnant women should receive 1 dose of Tdap vaccine during each pregnancy. 1 dose of Td every 10 years.  Varicella vaccine.** / Consult your health care provider. Pregnant females who do not have evidence of immunity should receive the first dose after pregnancy.  Zoster vaccine.** / 1 dose for adults aged 55 years or older.  Measles, mumps, rubella (MMR) vaccine.** / You need at least 1 dose of MMR if you were born in 1957 or later. You may also need a 2nd dose. For females of childbearing age, rubella immunity should be determined. If there is no evidence of immunity, females who are not pregnant should be vaccinated. If there is no evidence of immunity, females who are pregnant should delay immunization until after pregnancy.  Pneumococcal 13-valent conjugate (PCV13) vaccine.** / Consult your health care provider.  Pneumococcal polysaccharide (PPSV23) vaccine.** / 1 to 2 doses if you smoke cigarettes or if you have certain conditions.  Meningococcal vaccine.** /  Consult your health care provider.  Hepatitis A vaccine.** / Consult your health care provider.  Hepatitis B vaccine.** / Consult your health care provider.  Haemophilus influenzae type b (Hib) vaccine.** / Consult your health care provider. Ages 60 years and over  Blood pressure check.** / Every 1 to 2 years.  Lipid and cholesterol check.** / Every 5 years beginning at age 36 years.  Lung cancer screening. / Every year if you are aged 60-80 years and have a 30-pack-year history  of smoking and currently smoke or have quit within the past 15 years. Yearly screening is stopped once you have quit smoking for at least 15 years or develop a health problem that would prevent you from having lung cancer treatment.  Clinical breast exam.** / Every year after age 70 years.  BRCA-related cancer risk assessment.** / For women who have family members with a BRCA-related cancer (breast, ovarian, tubal, or peritoneal cancers).  Mammogram.** / Every year beginning at age 102 years and continuing for as long as you are in good health. Consult with your health care provider.  Pap test.** / Every 3 years starting at age 24 years through age 24 or 9 years with 3 consecutive normal Pap tests. Testing can be stopped between 65 and 70 years with 3 consecutive normal Pap tests and no abnormal Pap or HPV tests in the past 10 years.  HPV screening.** / Every 3 years from ages 38 years through ages 12 or 70 years with a history of 3 consecutive normal Pap tests. Testing can be stopped between 65 and 70 years with 3 consecutive normal Pap tests and no abnormal Pap or HPV tests in the past 10 years.  Fecal occult blood test (FOBT) of stool. / Every year beginning at age 39 years and continuing until age 42 years. You may not need to do this test if you get a colonoscopy every 10 years.  Flexible sigmoidoscopy or colonoscopy.** / Every 5 years for a flexible sigmoidoscopy or every 10 years for a colonoscopy beginning at age 25 years and continuing until age 99 years.  Hepatitis C blood test.** / For all people born from [birth year] through 1965 and any individual with known risks for hepatitis C.  Osteoporosis screening.** / A one-time screening for women ages 87 years and over and women at risk for fractures or osteoporosis.  Skin self-exam. / Monthly.  Influenza vaccine. / Every year.  Tetanus, diphtheria, and acellular pertussis (Tdap/Td) vaccine.** / 1 dose of Td every 10 years.  Varicella  vaccine.** / Consult your health care provider.  Zoster vaccine.** / 1 dose for adults aged 43 years or older.  Pneumococcal 13-valent conjugate (PCV13) vaccine.** / Consult your health care provider.  Pneumococcal polysaccharide (PPSV23) vaccine.** / 1 dose for all adults aged 69 years and older.  Meningococcal vaccine.** / Consult your health care provider.  Hepatitis A vaccine.** / Consult your health care provider.  Hepatitis B vaccine.** / Consult your health care provider.  Haemophilus influenzae type b (Hib) vaccine.** / Consult your health care provider. ** Family history and personal history of risk and conditions may change your health care provider's recommendations. Document Released: 06/30/2001 Document Revised: 09/18/2013 Document Reviewed: 09/29/2010 [facility] Patient Information 2015 [place], [place]. This information is not intended to replace advice given to you by your health care provider. Make sure you discuss any questions you have with your health care provider.

## 2014-09-26 NOTE — Assessment & Plan Note (Signed)
Examination within normal limits. Pap obtained and sent to lab for cytology.

## 2014-09-27 ENCOUNTER — Telehealth: Payer: Self-pay | Admitting: Physician Assistant

## 2014-09-27 LAB — CYTOLOGY - PAP

## 2014-09-27 MED ORDER — SIMVASTATIN 20 MG PO TABS: 20.0000 mg | ORAL_TABLET | Freq: Every day | ORAL | Status: DC

## 2014-09-27 NOTE — Telephone Encounter (Signed)
Labs are good overall. Potassium slightly low so increase foods high in potassium -- bananas, beans, fortified cereals, etc. Can find a comprehensive list of these foods online. Her cholesterol is high with LDL > 190. Need to start medication as she is at risk for stroke and heart attack.  I have sent in Rx simvastatin to her pharmacy to take daily.  Follow-up in 2 months.  Lastly, nothing to give cause for nerve symptoms.  Please have her follow-up with her Neurologist for further assessment.

## 2014-09-28 NOTE — Telephone Encounter (Signed)
Called and left message for pt to return call.  

## 2014-10-01 ENCOUNTER — Ambulatory Visit (HOSPITAL_BASED_OUTPATIENT_CLINIC_OR_DEPARTMENT_OTHER)

## 2014-10-05 ENCOUNTER — Ambulatory Visit (HOSPITAL_BASED_OUTPATIENT_CLINIC_OR_DEPARTMENT_OTHER)
Admission: RE | Admit: 2014-10-05 | Discharge: 2014-10-05 | Disposition: A | Discharge status: Home / Self Care | Source: Ambulatory Visit | Attending: Physician Assistant | Admitting: Physician Assistant

## 2014-10-05 DIAGNOSIS — Z1231 Encounter for screening mammogram for malignant neoplasm of breast: Secondary | ICD-10-CM | POA: Insufficient documentation

## 2014-10-05 DIAGNOSIS — Z1239 Encounter for other screening for malignant neoplasm of breast: Secondary | ICD-10-CM

## 2014-10-18 ENCOUNTER — Other Ambulatory Visit: Payer: Self-pay | Admitting: Physician Assistant

## 2014-10-18 DIAGNOSIS — R928 Other abnormal and inconclusive findings on diagnostic imaging of breast: Secondary | ICD-10-CM

## 2014-10-23 ENCOUNTER — Ambulatory Visit
Admission: RE | Admit: 2014-10-23 | Discharge: 2014-10-23 | Disposition: A | Discharge status: Home / Self Care | Source: Ambulatory Visit | Attending: Physician Assistant | Admitting: Physician Assistant

## 2014-10-23 DIAGNOSIS — R928 Other abnormal and inconclusive findings on diagnostic imaging of breast: Secondary | ICD-10-CM

## 2014-11-23 ENCOUNTER — Other Ambulatory Visit: Payer: Self-pay | Admitting: *Deleted

## 2014-11-23 MED ORDER — MEMANTINE HCL ER 28 MG PO CP24: 28.0000 mg | ORAL_CAPSULE | Freq: Every day | ORAL | Status: DC

## 2014-11-26 ENCOUNTER — Telehealth: Payer: Self-pay | Admitting: *Deleted

## 2014-11-26 NOTE — Telephone Encounter (Signed)
Called and [facility] @ 11:59am @ [phone]) asking the pt to RTC regarding med refill request from [facility].//AB/CMA

## 2014-11-27 MED ORDER — ESCITALOPRAM OXALATE 20 MG PO TABS: 20.0000 mg | ORAL_TABLET | Freq: Every day | ORAL | Status: DC

## 2014-11-27 MED ORDER — SIMVASTATIN 20 MG PO TABS: 20.0000 mg | ORAL_TABLET | Freq: Every day | ORAL | Status: DC

## 2014-11-27 NOTE — Telephone Encounter (Signed)
Rx's sent to the pharmacy by [facility].//AB/CMA 

## 2015-04-07 ENCOUNTER — Other Ambulatory Visit: Payer: Self-pay | Admitting: Physician Assistant

## 2015-04-19 ENCOUNTER — Telehealth: Payer: Self-pay | Admitting: Physician Assistant

## 2015-04-19 DIAGNOSIS — R928 Other abnormal and inconclusive findings on diagnostic imaging of breast: Secondary | ICD-10-CM

## 2015-04-19 NOTE — Telephone Encounter (Signed)
Patient is due for a repeat diagnostic bilateral mammogram to follow-up from last imaging 6 months ago. I have placed the order as promised. She will be contacted to schedule.

## 2015-04-22 NOTE — Telephone Encounter (Signed)
Called and spoke with the pt's brother Marcie Bal) and informed him of the note below regarding the repeat mammogram.  He understood and agreed.//AB/CMA

## 2015-06-11 ENCOUNTER — Ambulatory Visit
Admission: RE | Admit: 2015-06-11 | Discharge: 2015-06-11 | Disposition: A | Discharge status: Home / Self Care | Source: Ambulatory Visit | Attending: Physician Assistant | Admitting: Physician Assistant

## 2015-06-11 DIAGNOSIS — R928 Other abnormal and inconclusive findings on diagnostic imaging of breast: Secondary | ICD-10-CM

## 2015-07-16 ENCOUNTER — Ambulatory Visit (INDEPENDENT_AMBULATORY_CARE_PROVIDER_SITE_OTHER): Admitting: Family Medicine

## 2015-07-16 ENCOUNTER — Encounter: Payer: Self-pay | Admitting: Family Medicine

## 2015-07-16 VITALS — BP 107/76 | HR 96 | Temp 98.7°F | Resp 20 | Wt 125.5 lb

## 2015-07-16 DIAGNOSIS — J0100 Acute maxillary sinusitis, unspecified: Secondary | ICD-10-CM | POA: Insufficient documentation

## 2015-07-16 MED ORDER — AMOXICILLIN-POT CLAVULANATE 875-125 MG PO TABS: 1.0000 | ORAL_TABLET | Freq: Two times a day (BID) | ORAL | Status: DC

## 2015-07-16 MED ORDER — FLUTICASONE PROPIONATE 50 MCG/ACT NA SUSP: 2.0000 | Freq: Every day | NASAL | Status: DC

## 2015-07-16 NOTE — Progress Notes (Signed)
Patient ID: Alexandria Oliver, female   DOB: 09-24-1955, 60 y.o.   MRN: [id number]    Alexandria Oliver , 11-29-1955, 60 y.o., female MRN: [id number]  CC: Cough Subjective: Pt presents for an acute OV with her brother with complaints of cough, rhinorrhea, nasal congestion, fever (101.8), of 4 days duration. They deny headache, sore throat, myalgias, diarrhea, abd pain. Pt has moderate dementia and brother assists with her care. History was obtained from both patient and her brother. Her brother was sick last week from a flu-like illnes (no diagnosis). They have not tried any medications for symptoms. Immunizations are not UTD.  Marland KitchenNo Known Allergies Social History  Substance Use Topics  . Smoking status: Never Smoker   . Smokeless tobacco: Never Used  . Alcohol Use: Yes     Comment: rare   Past Medical History  Diagnosis Date  . Depression   . Acute confusion   . Chicken pox    Past Surgical History  Procedure Laterality Date  . Unremarkable.     Family History  Problem Relation Age of Onset  . Asthma Sister   . Diabetes Father     Deceased  . Cancer Father 70    Multiple Myeloma  . Heart defect Sister   . Heart defect Mother     Deceased  . Heart defect Brother   . Migraines Other     Familial  . Heart attack Sister   . Healthy Brother     x4  . Healthy Sister     x2  . Alzheimer's disease Maternal Grandfather     Dementia  . Liver disease Maternal Uncle   . Healthy Son     x1     Medication List       This list is accurate as of: 07/16/15  4:34 PM.  Always use your most recent med list.               Biotin 1 MG Caps  Take by mouth.     Cinnamon 500 MG capsule  Take 500 mg by mouth daily. Reported on 07/16/2015     donepezil 10 MG tablet  Commonly known as:  ARICEPT     escitalopram 20 MG tablet  Commonly known as:  LEXAPRO  TAKE 1 TABLET DAILY     multivitamin tablet  Take 1 tablet by mouth daily.     NAMENDA XR 28 MG Cp24 24 hr capsule  Generic  drug:  memantine  TAKE ONE CAPSULE BY MOUTH EVERY DAY     memantine 28 MG Cp24 24 hr capsule  Commonly known as:  NAMENDA XR  Take 1 capsule (28 mg total) by mouth daily.     simvastatin 20 MG tablet  Commonly known as:  ZOCOR  TAKE 1 TABLET AT BEDTIME     UNABLE TO FIND  Take by mouth.     VITAMIN D-1000 MAX ST 1000 units tablet  Generic drug:  Cholecalciferol  Take by mouth.        ROS: Negative, with the exception of above mentioned in HPI  Objective:  BP 107/76 mmHg  Pulse 96  Temp(Src) 98.7 F (37.1 C)  Resp 20  Wt 125 lb 8 oz (56.926 kg)  SpO2 95% Body mass index is 23.73 kg/(m^2). Gen: Afebrile. No acute distress. Nontoxic in appearance, well developed well nourished female. Very pleasant. Moderately demented female. Shuffle gait.  HENT: AT. [place]. Bilateral TM visualized, shiny/full in appearance.MMM, no oral lesions. Bilateral nares with  severe erythema, swelling, drainage and excoriation. Throat without erythema or exudates. No cough on exam. No TTP max sinus. No hoarseness. Eyes:Pupils Equal Round Reactive to light, Extraocular movements intact,  Conjunctiva without redness, discharge or icterus. Watery. Neck/lymp/endocrine: Supple, bilateral ant cervical lymphadenopathy CV: RRR  Chest: CTAB, no wheeze or crackles. Good air movement, normal resp effort.  Abd: Soft.  NTND. BS present Skin: No rashes, purpura or petechiae.  Neuro:  PERLA. EOMi. Alert. Oriented  Assessment/Plan: Alexandria Oliver is a 60 y.o. female present for acute OV for  1. Acute maxillary sinusitis, recurrence not specified - rest, hydrate, plain mucinex if needed. Pt may have had flu that has led to sinus infection, however she is out of the therapeutic window for tamiflu, thus did not test. - amoxicillin-clavulanate (AUGMENTIN) 875-125 MG tablet; Take 1 tablet by mouth 2 (two) times daily.  Dispense: 20 tablet; Refill: 0 - fluticasone (FLONASE) 50 MCG/ACT nasal spray; Place 2 sprays into both  nostrils daily.  Dispense: 16 g; Refill: 6   electronically signed by:  Howard Pouch, DO  [place]

## 2015-07-16 NOTE — Patient Instructions (Signed)
Augmentin and flonase prescribed Rest and hydrate advil or tylenol for fever  Sinusitis, Adult Sinusitis is redness, soreness, and inflammation of the paranasal sinuses. Paranasal sinuses are air pockets within the bones of your face. They are located beneath your eyes, in the middle of your forehead, and above your eyes. In healthy paranasal sinuses, mucus is able to drain out, and air is able to circulate through them by way of your nose. However, when your paranasal sinuses are inflamed, mucus and air can become trapped. This can allow bacteria and other germs to grow and cause infection. Sinusitis can develop quickly and last only a short time (acute) or continue over a long period (chronic). Sinusitis that lasts for more than 12 weeks is considered chronic. CAUSES Causes of sinusitis include:  Allergies.  Structural abnormalities, such as displacement of the cartilage that separates your nostrils (deviated septum), which can decrease the air flow through your nose and sinuses and affect sinus drainage.  Functional abnormalities, such as when the small hairs (cilia) that line your sinuses and help remove mucus do not work properly or are not present. SIGNS AND SYMPTOMS Symptoms of acute and chronic sinusitis are the same. The primary symptoms are pain and pressure around the affected sinuses. Other symptoms include:  Upper toothache.  Earache.  Headache.  Bad breath.  Decreased sense of smell and taste.  A cough, which worsens when you are lying flat.  Fatigue.  Fever.  Thick drainage from your nose, which often is green and may contain pus (purulent).  Swelling and warmth over the affected sinuses. DIAGNOSIS Your health care provider will perform a physical exam. During your exam, your health care provider may perform any of the following to help determine if you have acute sinusitis or chronic sinusitis:  Look in your nose for signs of abnormal growths in your nostrils  (nasal polyps).  Tap over the affected sinus to check for signs of infection.  View the inside of your sinuses using an imaging device that has a light attached (endoscope). If your health care provider suspects that you have chronic sinusitis, one or more of the following tests may be recommended:  Allergy tests.  Nasal culture. A sample of mucus is taken from your nose, sent to a lab, and screened for bacteria.  Nasal cytology. A sample of mucus is taken from your nose and examined by your health care provider to determine if your sinusitis is related to an allergy. TREATMENT Most cases of acute sinusitis are related to a viral infection and will resolve on their own within 10 days. Sometimes, medicines are prescribed to help relieve symptoms of both acute and chronic sinusitis. These may include pain medicines, decongestants, nasal steroid sprays, or saline sprays. However, for sinusitis related to a bacterial infection, your health care provider will prescribe antibiotic medicines. These are medicines that will help kill the bacteria causing the infection. Rarely, sinusitis is caused by a fungal infection. In these cases, your health care provider will prescribe antifungal medicine. For some cases of chronic sinusitis, surgery is needed. Generally, these are cases in which sinusitis recurs more than 3 times per year, despite other treatments. HOME CARE INSTRUCTIONS  Drink plenty of water. Water helps thin the mucus so your sinuses can drain more easily.  Use a humidifier.  Inhale steam 3-4 times a day (for example, sit in the bathroom with the shower running).  Apply a warm, moist washcloth to your face 3-4 times a day, or as  directed by your health care provider.  Use saline nasal sprays to help moisten and clean your sinuses.  Take medicines only as directed by your health care provider.  If you were prescribed either an antibiotic or antifungal medicine, finish it all even if  you start to feel better. SEEK IMMEDIATE MEDICAL CARE IF:  You have increasing pain or severe headaches.  You have nausea, vomiting, or drowsiness.  You have swelling around your face.  You have vision problems.  You have a stiff neck.  You have difficulty breathing.   This information is not intended to replace advice given to you by your health care provider. Make sure you discuss any questions you have with your health care provider.   Document Released: 05/04/2005 Document Revised: 05/25/2014 Document Reviewed: 05/19/2011 Elsevier Interactive Patient Education [employer].

## 2015-07-19 ENCOUNTER — Telehealth: Payer: Self-pay | Admitting: Family Medicine

## 2015-07-19 NOTE — Telephone Encounter (Signed)
Patient POA called in to advise that 2 of his children have tested positive for the flu. Should she be swabbed? Please call him back.

## 2015-07-19 NOTE — Telephone Encounter (Signed)
Spoke with patient POA no need  for swab unless symptoms. If symptoms over the weekend can be seen at [facility] location or Urgent care.

## 2015-10-02 ENCOUNTER — Other Ambulatory Visit: Payer: Self-pay | Admitting: Physician Assistant

## 2015-10-02 NOTE — Telephone Encounter (Signed)
Rx request to pharmacy/SLS Requested drug refills are authorized 30-day ONLY, however, the patient needs further evaluation and/or laboratory testing before further refills are given. Ask her to make an appointment for this.  Please call patient and schedule CPE w/Fasting lab appointment prior to future refill authorizations per provider/SLS Thanks.

## 2015-10-03 NOTE — Telephone Encounter (Signed)
Lm to schedule OV

## 2015-10-29 ENCOUNTER — Ambulatory Visit (INDEPENDENT_AMBULATORY_CARE_PROVIDER_SITE_OTHER): Admitting: Physician Assistant

## 2015-10-29 ENCOUNTER — Encounter: Payer: Self-pay | Admitting: Gastroenterology

## 2015-10-29 VITALS — BP 118/78 | HR 68 | Temp 98.5°F | Resp 16 | Ht 61.0 in | Wt 123.4 lb

## 2015-10-29 DIAGNOSIS — Z1211 Encounter for screening for malignant neoplasm of colon: Secondary | ICD-10-CM

## 2015-10-29 DIAGNOSIS — E785 Hyperlipidemia, unspecified: Secondary | ICD-10-CM

## 2015-10-29 DIAGNOSIS — F32A Depression, unspecified: Secondary | ICD-10-CM

## 2015-10-29 DIAGNOSIS — F329 Major depressive disorder, single episode, unspecified: Secondary | ICD-10-CM | POA: Diagnosis not present

## 2015-10-29 LAB — COMPREHENSIVE METABOLIC PANEL
ALBUMIN: 4.7 g/dL (ref 3.5–5.2)
ALK PHOS: 58 U/L (ref 39–117)
ALT: 56 U/L — ABNORMAL HIGH (ref 0–35)
AST: 36 U/L (ref 0–37)
BUN: 16 mg/dL (ref 6–23)
CALCIUM: 10 mg/dL (ref 8.4–10.5)
CHLORIDE: 103 meq/L (ref 96–112)
CO2: 30 mEq/L (ref 19–32)
Creatinine, Ser: 0.74 mg/dL (ref 0.40–1.20)
GFR: 85.21 mL/min (ref >60.00)
Glucose, Bld: 98 mg/dL (ref 70–99)
POTASSIUM: 3.7 meq/L (ref 3.5–5.1)
SODIUM: 139 meq/L (ref 135–145)
TOTAL PROTEIN: 7.9 g/dL (ref 6.0–8.3)
Total Bilirubin: 0.6 mg/dL (ref 0.2–1.2)

## 2015-10-29 LAB — LIPID PANEL
CHOL/HDL RATIO: 4
CHOLESTEROL: 205 mg/dL — AB (ref 0–200)
HDL: 48.7 mg/dL (ref >39.00)
NonHDL: 156.38
TRIGLYCERIDES: 248 mg/dL — AB (ref 0.0–149.0)
VLDL: 49.6 mg/dL — AB (ref 0.0–40.0)

## 2015-10-29 LAB — LDL CHOLESTEROL, DIRECT: Direct LDL: 117 mg/dL

## 2015-10-29 MED ORDER — SIMVASTATIN 20 MG PO TABS: 20.0000 mg | ORAL_TABLET | Freq: Every day | ORAL | Status: DC

## 2015-10-29 MED ORDER — ESCITALOPRAM OXALATE 20 MG PO TABS: 20.0000 mg | ORAL_TABLET | Freq: Every day | ORAL | Status: DC

## 2015-10-29 NOTE — Assessment & Plan Note (Signed)
Doing very well. We'll continue current regimen. Follow-up in 6 months for CPE.

## 2015-10-29 NOTE — Assessment & Plan Note (Signed)
Referral to GI placed for screening colonoscopy. They will contact patient schedule.

## 2015-10-29 NOTE — Assessment & Plan Note (Signed)
Previously well controlled. Tolerating statin well. We'll check lipids and LFTs today.

## 2015-10-29 NOTE — Progress Notes (Signed)
Pre visit review using our clinic review tool, if applicable. No additional management support is needed unless otherwise documented below in the visit note/SLS  

## 2015-10-29 NOTE — Progress Notes (Signed)
Patient presents to clinic today for follow-up of chronic medical conditions. Her brother (caretaker) is present for the entire interview and examination.   Hyperlipidemia --patient is currently on regimen of Zocor 20 mg endorses taking daily as directed. Denies myalgias or arthralgias with this medication. Is watching diet and trying to stay active.   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 Patient is followed by neurology for dementia. Endorses taking Namenda as directed. Patient has been on Lexapro 20 mg daily for some time. Endorses doing very well with this regimen. Denies depressed mood, anhedonia or anxiety on this regimen. Denies suicidal thought or ideation.  Patient is overdue for screening colonoscopy. Brother denies family history of colorectal cancer. Patient denies change in stools, unexplainable weight loss or fatigue.  Past Medical History  Diagnosis Date  . Depression   . Acute confusion   . Chicken pox     Current Outpatient Prescriptions on File Prior to Visit  Medication Sig Dispense Refill  . Biotin 1 MG CAPS Take by mouth.    . Cholecalciferol (VITAMIN D-1000 MAX ST) 1000 units tablet Take by mouth.    . donepezil (ARICEPT) 10 MG tablet   3  . fluticasone (FLONASE) 50 MCG/ACT nasal spray Place 2 sprays into both nostrils daily. 16 g 6  . memantine (NAMENDA XR) 28 MG CP24 24 hr capsule Take 1 capsule (28 mg total) by mouth daily. 90 capsule 4  . Multiple Vitamin (MULTIVITAMIN) tablet Take 1 tablet by mouth daily.    Marland Kitchen NAMENDA XR 28 MG CP24 24 hr capsule TAKE ONE CAPSULE BY MOUTH EVERY DAY 30 capsule 3  . UNABLE TO FIND Take by mouth.     No current facility-administered medications on file prior to visit.    No Known Allergies  Family History  Problem Relation Age of Onset  . Asthma Sister   . Diabetes Father     Deceased  . Cancer Father 30    Multiple Myeloma  . Heart defect Sister   . Heart defect Mother     Deceased  . Heart defect  Brother   . Migraines Other     Familial  . Heart attack Sister   . Healthy Brother     x4  . Healthy Sister     x2  . Alzheimer's disease Maternal Grandfather     Dementia  . Liver disease Maternal Uncle   . Healthy Son     x1    Social History   Social History  . Marital Status: Widowed    Spouse Name: N/A  . Number of Children: N/A  . Years of Education: N/A   Social History Main Topics  . Smoking status: Never Smoker   . Smokeless tobacco: Never Used  . Alcohol Use: Yes     Comment: rare  . Drug Use: No  . Sexual Activity: No   Other Topics Concern  . Not on file   Social History Narrative    Review of Systems - See HPI.  All other ROS are negative.  BP 118/78 mmHg  Pulse 68  Temp(Src) 98.5 F (36.9 C) (Oral)  Resp 16  Ht 5' 1"  (1.549 m)  Wt 123 lb 6 oz (55.963 kg)  BMI 23.32 kg/m2  SpO2 97%  Physical Exam  Constitutional: She is oriented to person, place, and time and well-developed, well-nourished, and in no distress.  HENT:  Head: Normocephalic and atraumatic.  Eyes: Conjunctivae are normal.  Neck: Neck  supple.  Cardiovascular: Normal rate, regular rhythm, normal heart sounds and intact distal pulses.   Pulmonary/Chest: Effort normal and breath sounds normal. No respiratory distress. She has no wheezes. She has no rales. She exhibits no tenderness.  Neurological: She is alert and oriented to person, place, and time.  Skin: Skin is warm and dry. No rash noted.  Psychiatric: Affect normal.  Vitals reviewed.   No results found for this or any previous visit (from the past 2160 hour(s)).  Assessment/Plan: Depression Doing very well. We'll continue current regimen. Follow-up in 6 months for CPE.  Hyperlipidemia Previously well controlled. Tolerating statin well. We'll check lipids and LFTs today.  Colon cancer screening Referral to GI placed for screening colonoscopy. They will contact patient schedule.

## 2015-10-29 NOTE — Patient Instructions (Signed)
I am glad you are doing well! Continue medications as directed. Follow-up with Dr. Tomi Likens as scheduled. Schedule an appointment for a physical in 6 months.  You will be contacted to schedule a screening colonoscopy.

## 2015-10-30 ENCOUNTER — Telehealth: Payer: Self-pay | Admitting: *Deleted

## 2015-10-30 DIAGNOSIS — R945 Abnormal results of liver function studies: Principal | ICD-10-CM

## 2015-10-30 DIAGNOSIS — R7989 Other specified abnormal findings of blood chemistry: Secondary | ICD-10-CM

## 2015-10-30 NOTE — Telephone Encounter (Signed)
Called and spoke with the pt's brother Beverlyn Roux) and informed him of the pt's recent lab results and note.  He verbalized understanding and agreed.  Pt was scheduled a 3-4 week lab appt for (Thurs-12/05/15 @ 8:15am).  Future lab ordered and sent.  Pt already has a 6 mos f/u appt scheduled for CPE on (Tues-04/28/16 @ 9:30am).//AB/CMA

## 2015-10-30 NOTE — Telephone Encounter (Signed)
-----   Message from Brunetta Jeans, PA-C sent at 10/30/2015  7:10 AM EDT ----- Cholesterol stable overall but TGL were elevated. I suspect she was not fasting yesterday. Have her keep up with diet and exercise. ALT was just slightly elevated which is new for her. Do not feel is anything worrisome at present but we should recheck LFTs in 3-4 weeks. Limit tylenol-containing products and any alcohol. Again FU lab appointment in 3-4 weeks for LFTs. FU with me in 6 months.

## 2015-12-05 ENCOUNTER — Other Ambulatory Visit (INDEPENDENT_AMBULATORY_CARE_PROVIDER_SITE_OTHER): Payer: TRICARE For Life (TFL)

## 2015-12-05 DIAGNOSIS — R945 Abnormal results of liver function studies: Principal | ICD-10-CM

## 2015-12-05 DIAGNOSIS — R7989 Other specified abnormal findings of blood chemistry: Secondary | ICD-10-CM | POA: Diagnosis not present

## 2015-12-05 LAB — HEPATIC FUNCTION PANEL
ALBUMIN: 4.6 g/dL (ref 3.5–5.2)
ALT: 70 U/L — ABNORMAL HIGH (ref 0–35)
AST: 40 U/L — ABNORMAL HIGH (ref 0–37)
Alkaline Phosphatase: 63 U/L (ref 39–117)
Bilirubin, Direct: 0.2 mg/dL (ref 0.0–0.3)
TOTAL PROTEIN: 8.1 g/dL (ref 6.0–8.3)
Total Bilirubin: 0.9 mg/dL (ref 0.2–1.2)

## 2015-12-09 ENCOUNTER — Telehealth: Payer: Self-pay

## 2015-12-09 DIAGNOSIS — R748 Abnormal levels of other serum enzymes: Secondary | ICD-10-CM

## 2015-12-09 NOTE — Telephone Encounter (Signed)
Thank you for your assistance!

## 2015-12-09 NOTE — Telephone Encounter (Signed)
-----   Message from Brunetta Jeans, PA-C sent at 12/08/2015 11:43 AM EDT ----- Liver enzymes remain mildly elevated. Want to check US abdomen (RUQ limited) to assess for fatty liver. Ok to place order if patient willing.

## 2015-12-09 NOTE — Telephone Encounter (Signed)
Patient is willing to get the abdominal ultrasound and the order has been placed.

## 2015-12-16 ENCOUNTER — Ambulatory Visit (HOSPITAL_BASED_OUTPATIENT_CLINIC_OR_DEPARTMENT_OTHER)
Admission: RE | Admit: 2015-12-16 | Discharge: 2015-12-16 | Disposition: A | Discharge status: Home / Self Care | Source: Ambulatory Visit | Attending: Physician Assistant | Admitting: Physician Assistant

## 2015-12-16 DIAGNOSIS — R748 Abnormal levels of other serum enzymes: Secondary | ICD-10-CM | POA: Insufficient documentation

## 2015-12-16 DIAGNOSIS — R932 Abnormal findings on diagnostic imaging of liver and biliary tract: Secondary | ICD-10-CM | POA: Insufficient documentation

## 2015-12-16 DIAGNOSIS — R7989 Other specified abnormal findings of blood chemistry: Secondary | ICD-10-CM | POA: Diagnosis not present

## 2015-12-25 ENCOUNTER — Ambulatory Visit: Admitting: Gastroenterology

## 2015-12-26 ENCOUNTER — Ambulatory Visit: Admitting: Gastroenterology

## 2016-02-25 ENCOUNTER — Ambulatory Visit: Payer: TRICARE For Life (TFL) | Admitting: Gastroenterology

## 2016-02-25 ENCOUNTER — Telehealth: Payer: Self-pay | Admitting: Gastroenterology

## 2016-02-26 NOTE — Telephone Encounter (Signed)
Not this time, but next time will be

## 2016-04-27 ENCOUNTER — Telehealth: Payer: Self-pay | Admitting: Behavioral Health

## 2016-04-27 NOTE — Telephone Encounter (Signed)
Unable to reach patient at time of Pre-Visit Call.  Left message for patient to return call when available.    

## 2016-04-28 ENCOUNTER — Encounter: Admitting: Physician Assistant

## 2016-04-28 ENCOUNTER — Ambulatory Visit (INDEPENDENT_AMBULATORY_CARE_PROVIDER_SITE_OTHER): Admitting: Medical

## 2016-04-28 ENCOUNTER — Encounter: Payer: Self-pay | Admitting: Medical

## 2016-04-28 VITALS — BP 118/76 | HR 62 | Temp 97.9°F | Ht 61.0 in | Wt 122.8 lb

## 2016-04-28 DIAGNOSIS — Z0000 Encounter for general adult medical examination without abnormal findings: Secondary | ICD-10-CM | POA: Diagnosis not present

## 2016-04-28 DIAGNOSIS — Z1239 Encounter for other screening for malignant neoplasm of breast: Secondary | ICD-10-CM

## 2016-04-28 DIAGNOSIS — Z1231 Encounter for screening mammogram for malignant neoplasm of breast: Secondary | ICD-10-CM | POA: Diagnosis not present

## 2016-04-28 DIAGNOSIS — Z1211 Encounter for screening for malignant neoplasm of colon: Secondary | ICD-10-CM

## 2016-04-28 DIAGNOSIS — Z23 Encounter for immunization: Secondary | ICD-10-CM

## 2016-04-28 DIAGNOSIS — Z1159 Encounter for screening for other viral diseases: Secondary | ICD-10-CM

## 2016-04-28 DIAGNOSIS — R8290 Unspecified abnormal findings in urine: Secondary | ICD-10-CM

## 2016-04-28 DIAGNOSIS — J0100 Acute maxillary sinusitis, unspecified: Secondary | ICD-10-CM

## 2016-04-28 LAB — COMPREHENSIVE METABOLIC PANEL
ALT: 51 U/L — AB (ref 0–35)
AST: 34 U/L (ref 0–37)
Albumin: 4.7 g/dL (ref 3.5–5.2)
Alkaline Phosphatase: 60 U/L (ref 39–117)
BUN: 13 mg/dL (ref 6–23)
CALCIUM: 9.9 mg/dL (ref 8.4–10.5)
CO2: 29 mEq/L (ref 19–32)
CREATININE: 0.69 mg/dL (ref 0.40–1.20)
Chloride: 103 mEq/L (ref 96–112)
GFR: 92.22 mL/min (ref >60.00)
Glucose, Bld: 125 mg/dL — ABNORMAL HIGH (ref 70–99)
Potassium: 3.6 mEq/L (ref 3.5–5.1)
Sodium: 140 mEq/L (ref 135–145)
Total Bilirubin: 0.8 mg/dL (ref 0.2–1.2)
Total Protein: 7.9 g/dL (ref 6.0–8.3)

## 2016-04-28 LAB — CBC WITH DIFFERENTIAL/PLATELET
BASOS PCT: 0.7 % (ref 0.0–3.0)
Basophils Absolute: 0 10*3/uL (ref 0.0–0.1)
Eosinophils Absolute: 0.1 10*3/uL (ref 0.0–0.7)
Eosinophils Relative: 2.3 % (ref 0.0–5.0)
HEMATOCRIT: 40.3 % (ref 36.0–46.0)
Hemoglobin: 13.5 g/dL (ref 12.0–15.0)
LYMPHS PCT: 36 % (ref 12.0–46.0)
Lymphs Abs: 1.8 10*3/uL (ref 0.7–4.0)
MCHC: 33.6 g/dL (ref 30.0–36.0)
MCV: 92.9 fl (ref 78.0–100.0)
MONOS PCT: 5.4 % (ref 3.0–12.0)
Monocytes Absolute: 0.3 10*3/uL (ref 0.1–1.0)
NEUTROS ABS: 2.7 10*3/uL (ref 1.4–7.7)
Neutrophils Relative %: 55.6 % (ref 43.0–77.0)
Platelets: 266 10*3/uL (ref 150.0–400.0)
RBC: 4.34 Mil/uL (ref 3.87–5.11)
RDW: 12.5 % (ref 11.5–15.5)
WBC: 4.9 10*3/uL (ref 4.0–10.5)

## 2016-04-28 LAB — POC URINALSYSI DIPSTICK (AUTOMATED)
Bilirubin, UA: NEGATIVE
Blood, UA: NEGATIVE
Glucose, UA: NEGATIVE
Ketones, UA: NEGATIVE
NITRITE UA: NEGATIVE
Spec Grav, UA: 1.02
UROBILINOGEN UA: 0.2
pH, UA: 6

## 2016-04-28 LAB — TSH: TSH: 0.69 u[IU]/mL (ref 0.35–4.50)

## 2016-04-28 LAB — LIPID PANEL
CHOLESTEROL: 188 mg/dL (ref 0–200)
HDL: 50.4 mg/dL (ref >39.00)
LDL Cholesterol: 114 mg/dL — ABNORMAL HIGH (ref 0–99)
NonHDL: 137.79
Total CHOL/HDL Ratio: 4
Triglycerides: 117 mg/dL (ref 0.0–149.0)
VLDL: 23.4 mg/dL (ref 0.0–40.0)

## 2016-04-28 MED ORDER — FLUTICASONE PROPIONATE 50 MCG/ACT NA SUSP: 2.0000 | Freq: Every day | NASAL | 1 refills | Status: DC

## 2016-04-28 MED ORDER — BENZONATATE 100 MG PO CAPS: 100.0000 mg | ORAL_CAPSULE | Freq: Three times a day (TID) | ORAL | 0 refills | Status: DC | PRN

## 2016-04-28 MED ORDER — DOXYCYCLINE HYCLATE 100 MG PO TABS: 100.0000 mg | ORAL_TABLET | Freq: Two times a day (BID) | ORAL | 0 refills | Status: DC

## 2016-04-28 MED FILL — BENZONATATE 100 MG CAPSULE: 100 | 7 days supply | Qty: 21 | Fill #0

## 2016-04-28 MED FILL — FLUTICASONE PROP 50 MCG SPR: 50 | 30 days supply | Qty: 16 | Fill #0

## 2016-04-28 MED FILL — DOXYCYCLINE HYCLATE 100 MG: 100 | 7 days supply | Qty: 14 | Fill #0

## 2016-04-28 NOTE — Progress Notes (Signed)
Subjective:    Patient ID: Alexandria Oliver, female    DOB: [birth year], 60 y.o.   MRN: [id number]  HPI  I have reviewed pt PMH, PSH, FH, Social History and Surgical History  Pt feels good today. Pt only states mild nasal congestion for one week. Mild cough. No sinus pain.  Pt is fasting.  Pt will get flu vaccine today.  Pt willing to get tdap today.  Pt has not had colonoscopy in past since she missed the appointment.  Pt willing to get hep c antibody test.  Pt does not exercise. Pt states states moderate healthy diet. Couple of sodas a week. Non smoker. No alcohol use.  Pt last pap was 2016 and normal.  Pt had negative mammogram last year.     Review of Systems  Constitutional: Negative for chills, fatigue and fever.  HENT: Positive for congestion. Negative for postnasal drip, sinus pain, sneezing, trouble swallowing and voice change.   Respiratory: Positive for cough. Negative for chest tightness, shortness of breath and wheezing.   Cardiovascular: Negative for chest pain and palpitations.  Gastrointestinal: Negative for abdominal pain and anal bleeding.  Genitourinary: Negative for difficulty urinating, flank pain, genital sores and hematuria.  Musculoskeletal: Negative for back pain.  Skin: Negative for rash.  Neurological: Negative for dizziness, syncope, speech difficulty, weakness, numbness and headaches.  Hematological: Negative for adenopathy. Does not bruise/bleed easily.  Psychiatric/Behavioral: Negative for behavioral problems, confusion, dysphoric mood and suicidal ideas. The patient is not nervous/anxious.    Past Medical History:  Diagnosis Date  . Acute confusion   . Chicken pox   . Depression      Social History   Social History  . Marital status: Widowed    Spouse name: N/A  . Number of children: N/A  . Years of education: N/A   Occupational History  . Not on file.   Social History Main Topics  . Smoking status: Never Smoker  .  Smokeless tobacco: Never Used  . Alcohol use Yes     Comment: rare  . Drug use: No  . Sexual activity: No   Other Topics Concern  . Not on file   Social History Narrative  . No narrative on file    Past Surgical History:  Procedure Laterality Date  . Unremarkable.      Family History  Problem Relation Age of Onset  . Asthma Sister   . Diabetes Father     Deceased  . Cancer Father 13    Multiple Myeloma  . Heart defect Sister   . Heart defect Mother     Deceased  . Heart defect Brother   . Migraines Other     Familial  . Heart attack Sister   . Healthy Brother     x4  . Healthy Sister     x2  . Alzheimer's disease Maternal Grandfather     Dementia  . Liver disease Maternal Uncle   . Healthy Son     x1    No Known Allergies  Current Outpatient Prescriptions on File Prior to Visit  Medication Sig Dispense Refill  . Biotin 1 MG CAPS Take by mouth.    . Cholecalciferol (VITAMIN D-1000 MAX ST) 1000 units tablet Take by mouth.    . donepezil (ARICEPT) 10 MG tablet   3  . escitalopram (LEXAPRO) 20 MG tablet Take 1 tablet (20 mg total) by mouth daily. 90 tablet 1  . fluticasone (FLONASE) 50 MCG/ACT nasal spray Place 2  sprays into both nostrils daily. 16 g 6  . memantine (NAMENDA XR) 28 MG CP24 24 hr capsule Take 1 capsule (28 mg total) by mouth daily. 90 capsule 4  . Multiple Vitamin (MULTIVITAMIN) tablet Take 1 tablet by mouth daily.    Marland Kitchen NAMENDA XR 28 MG CP24 24 hr capsule TAKE ONE CAPSULE BY MOUTH EVERY DAY 30 capsule 3  . simvastatin (ZOCOR) 20 MG tablet Take 1 tablet (20 mg total) by mouth at bedtime. 90 tablet 1  . UNABLE TO FIND Take by mouth.     No current facility-administered medications on file prior to visit.     BP 118/76 (BP Location: Left Arm, Patient Position: Sitting, Cuff Size: Normal)   Pulse 62   Temp 97.9 F (36.6 C) (Oral)   Ht _0  (1.549 m)   Wt 122 lb 12.8 oz (55.7 kg)   SpO2 98%   BMI 23.20 kg/m       Objective:   Physical  Exam  General  Mental Status - Alert. General Appearance - Well groomed. Not in acute distress.  Skin Rashes- No Rashes.  HEENT Head- Normal. Ear Auditory Canal - Left- Normal. Right - Normal.Tympanic Membrane- Left- Normal. Right- Normal. Eye Sclera/Conjunctiva- Left- Normal. Right- Normal. Nose & Sinuses Nasal Mucosa- Left-  Boggy and Congested. Right-  Boggy and  Congested.Bilateral faint  maxillary and  Faint frontal sinus pressure. Mouth & Throat Lips: Upper Lip- Normal: no dryness, cracking, pallor, cyanosis, or vesicular eruption. Lower Lip-Normal: no dryness, cracking, pallor, cyanosis or vesicular eruption. Buccal Mucosa- Bilateral- No Aphthous ulcers. Oropharynx- No Discharge or Erythema. Tonsils: Characteristics- Bilateral- No Erythema or Congestion. Size/Enlargement- Bilateral- No enlargement. Discharge- bilateral-None.  Neck Neck- Supple. No Masses.   Chest and Lung Exam Auscultation: Breath Sounds:-Clear even and unlabored.  Cardiovascular Auscultation:Rythm- Regular, rate and rhythm. Murmurs & Other Heart Sounds:Ausculatation of the heart reveal- No Murmurs.  Lymphatic Head & Neck General Head & Neck Lymphatics: Bilateral: Description- No Localized lymphadenopathy.   Neurologic Cranial Nerve exam:- CN III-XII intact(No nystagmus), symmetric smile. Strength:- 5/5 equal and symmetric strength both upper and lower extremities.  Breast- symmetic, no dc from nipple, no lymph nodes enlarged or tender. No lumps or masses Rt breast- 5-6 mm mole(rlq of breast). Uniform in color.(pt states has been like this for years and no change)  Abdomen- soft, nontender, nondistended, +bs. No rebound or guarding.       Assessment & Plan:  For your wellness exam will get labs and ua today.  Placed mammogram order today. You could go downstairs and see if they will schedule already.  GI referral for colonscopy placed Anderson Malta and Steffanie Dunn are contact persons in our office  if Gi does not call you in 2 weeks.  tdap and flu vaccine given today.  For one week uri vs early sinus infection/bronchitis, I want you to start flonase for nasal congestion and benzonatate for cough. If symptoms persist or worsen as described go ahead and start doxycycline.  Follow up date to be determined after lab review.  Eryk Beavers, Percell Miller, PA-C

## 2016-04-28 NOTE — Patient Instructions (Addendum)
For your wellness exam will get labs and ua today.  Placed mammogram order today. You could go downstairs and see if they will schedule already.  GI referral for colonscopy placed Anderson Malta and Steffanie Dunn are contact persons in our office if Gi does not call you in 2 weeks.  tdap and flu vaccine given today.  For one week uri vs early sinus infection/bronchitis, I want you to start flonase for nasal congestion and benzonatate for cough. If symptoms persist or worsen as described go ahead and start doxycycline.  Follow up date to be determined after lab review.   Preventive Care 40-64 Years, Female Preventive care refers to lifestyle choices and visits with your health care provider that can promote health and wellness. What does preventive care include?  A yearly physical exam. This is also called an annual well check.  Dental exams once or twice a year.  Routine eye exams. Ask your health care provider how often you should have your eyes checked.  Personal lifestyle choices, including:  Daily care of your teeth and gums.  Regular physical activity.  Eating a healthy diet.  Avoiding tobacco and drug use.  Limiting alcohol use.  Practicing safe sex.  Taking low-dose aspirin daily starting at age 39.  Taking vitamin and mineral supplements as recommended by your health care provider. What happens during an annual well check? The services and screenings done by your health care provider during your annual well check will depend on your age, overall health, lifestyle risk factors, and family history of disease. Counseling  Your health care provider may ask you questions about your:  Alcohol use.  Tobacco use.  Drug use.  Emotional well-being.  Home and relationship well-being.  Sexual activity.  Eating habits.  Work and work Statistician.  Method of birth control.  Menstrual cycle.  Pregnancy history. Screening  You may have the following tests or  measurements:  Height, weight, and BMI.  Blood pressure.  Lipid and cholesterol levels. These may be checked every 5 years, or more frequently if you are over 34 years old.  Skin check.  Lung cancer screening. You may have this screening every year starting at age 68 if you have a 30-pack-year history of smoking and currently smoke or have quit within the past 15 years.  Fecal occult blood test (FOBT) of the stool. You may have this test every year starting at age 23.  Flexible sigmoidoscopy or colonoscopy. You may have a sigmoidoscopy every 5 years or a colonoscopy every 10 years starting at age 1.  Hepatitis C blood test.  Hepatitis B blood test.  Sexually transmitted disease (STD) testing.  Diabetes screening. This is done by checking your blood sugar (glucose) after you have not eaten for a while (fasting). You may have this done every 1-3 years.  Mammogram. This may be done every 1-2 years. Talk to your health care provider about when you should start having regular mammograms. This may depend on whether you have a family history of breast cancer.  BRCA-related cancer screening. This may be done if you have a family history of breast, ovarian, tubal, or peritoneal cancers.  Pelvic exam and Pap test. This may be done every 3 years starting at age 51. Starting at age 53, this may be done every 5 years if you have a Pap test in combination with an HPV test.  Bone density scan. This is done to screen for osteoporosis. You may have this scan if you are at high  risk for osteoporosis. Discuss your test results, treatment options, and if necessary, the need for more tests with your health care provider. Vaccines  Your health care provider may recommend certain vaccines, such as:  Influenza vaccine. This is recommended every year.  Tetanus, diphtheria, and acellular pertussis (Tdap, Td) vaccine. You may need a Td booster every 10 years.  Varicella vaccine. You may need this if you  have not been vaccinated.  Zoster vaccine. You may need this after age 57.  Measles, mumps, and rubella (MMR) vaccine. You may need at least one dose of MMR if you were born in 1957 or later. You may also need a second dose.  Pneumococcal 13-valent conjugate (PCV13) vaccine. You may need this if you have certain conditions and were not previously vaccinated.  Pneumococcal polysaccharide (PPSV23) vaccine. You may need one or two doses if you smoke cigarettes or if you have certain conditions.  Meningococcal vaccine. You may need this if you have certain conditions.  Hepatitis A vaccine. You may need this if you have certain conditions or if you travel or work in places where you may be exposed to hepatitis A.  Hepatitis B vaccine. You may need this if you have certain conditions or if you travel or work in places where you may be exposed to hepatitis B.  Haemophilus influenzae type b (Hib) vaccine. You may need this if you have certain conditions. Talk to your health care provider about which screenings and vaccines you need and how often you need them. This information is not intended to replace advice given to you by your health care provider. Make sure you discuss any questions you have with your health care provider. Document Released: 05/31/2015 Document Revised: 01/22/2016 Document Reviewed: 03/05/2015 Elsevier Interactive Patient Education  2017 [employer].

## 2016-04-28 NOTE — Progress Notes (Signed)
Pre visit review using our clinic review tool, if applicable. No additional management support is needed unless otherwise documented below in the visit note. 

## 2016-04-29 ENCOUNTER — Ambulatory Visit: Payer: Self-pay | Admitting: Gastroenterology

## 2016-04-29 LAB — HEPATITIS C ANTIBODY: HCV Ab: NEGATIVE

## 2016-04-29 LAB — URINE CULTURE

## 2016-04-30 ENCOUNTER — Other Ambulatory Visit (INDEPENDENT_AMBULATORY_CARE_PROVIDER_SITE_OTHER)

## 2016-04-30 ENCOUNTER — Telehealth: Payer: Self-pay | Admitting: Medical

## 2016-04-30 DIAGNOSIS — R7309 Other abnormal glucose: Secondary | ICD-10-CM

## 2016-04-30 LAB — HEMOGLOBIN A1C: HEMOGLOBIN A1C: 5.4 % (ref 4.6–6.5)

## 2016-04-30 NOTE — Telephone Encounter (Signed)
Opened to review 

## 2016-05-28 ENCOUNTER — Other Ambulatory Visit: Payer: Self-pay

## 2016-05-28 MED ORDER — SIMVASTATIN 20 MG PO TABS: 20.0000 mg | ORAL_TABLET | Freq: Every day | ORAL | 1 refills | Status: DC

## 2016-05-28 MED ORDER — MEMANTINE HCL ER 28 MG PO CP24: 28.0000 mg | ORAL_CAPSULE | Freq: Every day | ORAL | 3 refills | Status: DC

## 2016-05-28 MED ORDER — ESCITALOPRAM OXALATE 20 MG PO TABS: 20.0000 mg | ORAL_TABLET | Freq: Every day | ORAL | 1 refills | Status: DC

## 2016-05-28 MED FILL — NAMENDA XR 28 MG CAPSULE: 28 | 30 days supply | Qty: 30 | Fill #0

## 2016-06-05 ENCOUNTER — Telehealth: Payer: Self-pay | Admitting: Medical

## 2016-06-05 MED ORDER — SIMVASTATIN 20 MG PO TABS: 20.0000 mg | ORAL_TABLET | Freq: Every day | ORAL | 1 refills | Status: DC

## 2016-06-05 MED ORDER — ESCITALOPRAM OXALATE 20 MG PO TABS: 20.0000 mg | ORAL_TABLET | Freq: Every day | ORAL | 1 refills | Status: DC

## 2016-06-05 MED ORDER — MEMANTINE HCL ER 28 MG PO CP24: 28.0000 mg | ORAL_CAPSULE | Freq: Every day | ORAL | 1 refills | Status: DC

## 2016-06-05 NOTE — Telephone Encounter (Signed)
Rx request to Mail Order pharmacy; D/C PREVIOUS SCRIPTS FOR THIS MEDICATION/SLS 01/19

## 2016-06-05 NOTE — Telephone Encounter (Signed)
[place], [place] [phone] (Phone) [phone] (Fax)    Reason for call:  90 day supply of the following medication escitalopram (LEXAPRO) 20 MG tablet, simvastatin (ZOCOR) 20 MG tablet, NAMENDA XR 28 MG CP24

## 2016-06-11 ENCOUNTER — Other Ambulatory Visit: Payer: Self-pay | Admitting: Medical

## 2016-06-11 ENCOUNTER — Ambulatory Visit (HOSPITAL_BASED_OUTPATIENT_CLINIC_OR_DEPARTMENT_OTHER)
Admission: RE | Admit: 2016-06-11 | Discharge: 2016-06-11 | Disposition: A | Discharge status: Home / Self Care | Payer: Medicare Other | Source: Ambulatory Visit | Attending: Medical | Admitting: Medical

## 2016-06-11 DIAGNOSIS — Z1231 Encounter for screening mammogram for malignant neoplasm of breast: Secondary | ICD-10-CM | POA: Diagnosis not present

## 2016-06-11 DIAGNOSIS — Z1239 Encounter for other screening for malignant neoplasm of breast: Secondary | ICD-10-CM

## 2016-06-18 ENCOUNTER — Encounter: Payer: Self-pay | Admitting: Gastroenterology

## 2016-06-18 ENCOUNTER — Ambulatory Visit (INDEPENDENT_AMBULATORY_CARE_PROVIDER_SITE_OTHER): Payer: Medicare Other | Admitting: Gastroenterology

## 2016-06-18 VITALS — BP 110/74 | HR 76 | Ht 61.0 in | Wt 120.6 lb

## 2016-06-18 DIAGNOSIS — F0280 Dementia in other diseases classified elsewhere without behavioral disturbance: Secondary | ICD-10-CM

## 2016-06-18 DIAGNOSIS — Z1211 Encounter for screening for malignant neoplasm of colon: Secondary | ICD-10-CM

## 2016-06-18 DIAGNOSIS — G300 Alzheimer's disease with early onset: Secondary | ICD-10-CM | POA: Diagnosis not present

## 2016-06-18 MED ORDER — NA SULFATE-K SULFATE-MG SULF 17.5-3.13-1.6 GM/177ML PO SOLN: 1.0000 | Freq: Once | ORAL | 0 refills | Status: AC

## 2016-06-18 MED FILL — SUPREP BOWEL PREP KIT: 17.5-3.13-1 | 1 days supply | Qty: 354 | Fill #0

## 2016-06-18 NOTE — Patient Instructions (Signed)
If you are age 61 or older, your body mass index should be between 23-30. Your Body mass index is 22.79 kg/m. If this is out of the aforementioned range listed, please consider follow up with your Primary Care Provider.  If you are age 82 or younger, your body mass index should be between 19-25. Your Body mass index is 22.79 kg/m. If this is out of the aformentioned range listed, please consider follow up with your Primary Care Provider.   You have been scheduled for a colonoscopy. Please follow written instructions given to you at your visit today.  Please pick up your prep supplies at the pharmacy within the next 1-3 days. If you use inhalers (even only as needed), please bring them with you on the day of your procedure. Your physician has requested that you go to www.startemmi.com and enter the access code given to you at your visit today. This web site gives a general overview about your procedure. However, you should still follow specific instructions given to you by our office regarding your preparation for the procedure.  Thank you for choosing [facility]  Dr Wilfrid Lund III

## 2016-06-18 NOTE — Progress Notes (Signed)
[facility] Consult Note:  History: Alexandria Oliver 06/18/2016  Referring physician: Mackie Pai, PA-C  Reason for consult/chief complaint: Colon Cancer Screening (Patietn has no compliants)   Subjective  HPI:  Colon cancer screening This is a 61 year old woman with early-onset dementia: He by her brother. She was sent by primary care to discuss colon cancer screening. She reportedly has no abdominal pain, altered bowel habits or rectal bleeding. There is no family history of colon cancer. She is highly functional and lives with her brother.  ROS:  Review of Systems  Denies chest pain dyspnea or dysuria . Past Medical History: Past Medical History:  Diagnosis Date  . Acute confusion   . Chicken pox   . Depression      Past Surgical History: Past Surgical History:  Procedure Laterality Date  . Unremarkable.       Family History: Family History  Problem Relation Age of Onset  . Diabetes Father     Deceased  . Cancer Father 14    Multiple Myeloma  . Heart defect Mother     Deceased  . Asthma Sister   . Heart defect Sister   . Heart defect Brother   . Migraines Other     Familial  . Heart attack Sister   . Healthy Brother     x4  . Healthy Sister     x2  . Alzheimer's disease Maternal Grandfather     Dementia  . Liver disease Maternal Uncle   . Healthy Son     x1    Social History: Social History   Social History  . Marital status: Widowed    Spouse name: N/A  . Number of children: N/A  . Years of education: N/A   Social History Main Topics  . Smoking status: Never Smoker  . Smokeless tobacco: Never Used  . Alcohol use Yes     Comment: rare  . Drug use: No  . Sexual activity: No   Other Topics Concern  . None   Social History Narrative  . None    Allergies: No Known Allergies  Outpatient Meds: Current Outpatient Prescriptions  Medication Sig Dispense Refill  . Biotin 1 MG CAPS Take by mouth.    .  Cholecalciferol (VITAMIN D-1000 MAX ST) 1000 units tablet Take by mouth.    . donepezil (ARICEPT) 10 MG tablet   3  . escitalopram (LEXAPRO) 20 MG tablet Take 1 tablet (20 mg total) by mouth daily. 90 tablet 1  . fluticasone (FLONASE) 50 MCG/ACT nasal spray Place 2 sprays into both nostrils daily. 16 g 6  . memantine (NAMENDA XR) 28 MG CP24 24 hr capsule Take 1 capsule (28 mg total) by mouth daily. 90 capsule 1  . Multiple Vitamin (MULTIVITAMIN) tablet Take 1 tablet by mouth daily.    . simvastatin (ZOCOR) 20 MG tablet Take 1 tablet (20 mg total) by mouth at bedtime. 90 tablet 1  . Na Sulfate-K Sulfate-Mg Sulf 17.5-3.13-1.6 GM/180ML SOLN Take 1 kit by mouth once. 354 mL 0   No current facility-administered medications for this visit.       ___________________________________________________________________ Objective   Exam:  BP 110/74   Pulse 76   Ht 5' 1"  (1.549 m)   Wt 120 lb 9.6 oz (54.7 kg)   BMI 22.79 kg/m  Brother present for the entire encounter  General: this is a(n) well-appearing woman with good muscle mass   Eyes: sclera anicteric, no redness  ENT: oral mucosa moist without lesions,  no cervical or supraclavicular lymphadenopathy, good dentition  CV: RRR without murmur, S1/S2, no JVD, no peripheral edema  Resp: clear to auscultation bilaterally, normal RR and effort noted  GI: soft, no tenderness, with active bowel sounds. No guarding or palpable organomegaly noted.  Neuro: awake, alert and oriented x 3. Normal gross motor function and fluent speech    Assessment: Encounter Diagnoses  Name Primary?  . Special screening for malignant neoplasms, colon Yes  . Early onset Alzheimer's dementia without behavioral disturbance    Average risk for colorectal cancer. She appears appropriate for colonoscopy. Bars discussed, patient's brother (power of attorney) is agreeable.   Thank you for the courtesy of this consult.  Please call me with any questions or  concerns.  Nelida Meuse III  CC: Mackie Pai, PA-C

## 2016-08-03 ENCOUNTER — Ambulatory Visit (AMBULATORY_SURGERY_CENTER): Payer: Medicare Other | Admitting: Gastroenterology

## 2016-08-03 ENCOUNTER — Encounter: Payer: Self-pay | Admitting: Gastroenterology

## 2016-08-03 VITALS — BP 112/75 | HR 56 | Temp 98.7°F | Resp 18 | Ht 61.0 in | Wt 120.0 lb

## 2016-08-03 DIAGNOSIS — Z1212 Encounter for screening for malignant neoplasm of rectum: Secondary | ICD-10-CM

## 2016-08-03 DIAGNOSIS — Z1211 Encounter for screening for malignant neoplasm of colon: Secondary | ICD-10-CM | POA: Diagnosis not present

## 2016-08-03 MED ORDER — SODIUM CHLORIDE 0.9 % IV SOLN: 500.0000 mL | INTRAVENOUS | Status: DC

## 2016-08-03 NOTE — Patient Instructions (Signed)
YOU HAD AN ENDOSCOPIC PROCEDURE TODAY AT THE [facility]:   Refer to the procedure report that was given to you for any specific questions about what was found during the examination.  If the procedure report does not answer your questions, please call your gastroenterologist to clarify.  If you requested that your care partner not be given the details of your procedure findings, then the procedure report has been included in a sealed envelope for you to review at your convenience later.  YOU SHOULD EXPECT: Some feelings of bloating in the abdomen. Passage of more gas than usual.  Walking can help get rid of the air that was put into your GI tract during the procedure and reduce the bloating. If you had a lower endoscopy (such as a colonoscopy or flexible sigmoidoscopy) you may notice spotting of blood in your stool or on the toilet paper. If you underwent a bowel prep for your procedure, you may not have a normal bowel movement for a few days.  Please Note:  You might notice some irritation and congestion in your nose or some drainage.  This is from the oxygen used during your procedure.  There is no need for concern and it should clear up in a day or so.  SYMPTOMS TO REPORT IMMEDIATELY:   Following lower endoscopy (colonoscopy or flexible sigmoidoscopy):  Excessive amounts of blood in the stool  Significant tenderness or worsening of abdominal pains  Swelling of the abdomen that is new, acute  Fever of 100F or higher   For urgent or emergent issues, a gastroenterologist can be reached at any hour by calling [phone].   DIET:  We do recommend a small meal at first, but then you may proceed to your regular diet.  Drink plenty of fluids but you should avoid alcoholic beverages for 24 hours.  ACTIVITY:  You should plan to take it easy for the rest of today and you should NOT DRIVE or use heavy machinery until tomorrow (because of the sedation medicines used during the test).     FOLLOW UP: Our staff will call the number listed on your records the next business day following your procedure to check on you and address any questions or concerns that you may have regarding the information given to you following your procedure. If we do not reach you, we will leave a message.  However, if you are feeling well and you are not experiencing any problems, there is no need to return our call.  We will assume that you have returned to your regular daily activities without incident.  If any biopsies were taken you will be contacted by phone or by letter within the next 1-3 weeks.  Please call us at [phone] if you have not heard about the biopsies in 3 weeks.    SIGNATURES/CONFIDENTIALITY: You and/or your care partner have signed paperwork which will be entered into your electronic medical record.  These signatures attest to the fact that that the information above on your After Visit Summary has been reviewed and is understood.  Full responsibility of the confidentiality of this discharge information lies with you and/or your care-partner.   Resume medications.  

## 2016-08-03 NOTE — Op Note (Signed)
[facility] Patient Name: Alexandria Oliver Procedure Date: 08/03/2016 1:54 PM MRN: [id number] Endoscopist: Mallie Mussel L. Loletha Carrow , MD Age: 61 Referring MD:  Date of Birth: 04/19/1956 Gender: Female Account #: [id number] Procedure:                Colonoscopy Indications:              Screening for colorectal malignant neoplasm, This                            is the patient's first colonoscopy Medicines:                Monitored Anesthesia Care Procedure:                Pre-Anesthesia Assessment:                           - Prior to the procedure, a History and Physical                            was performed, and patient medications and                            allergies were reviewed. The patient's tolerance of                            previous anesthesia was also reviewed. The risks                            and benefits of the procedure and the sedation                            options and risks were discussed with the patient.                            All questions were answered, and informed consent                            was obtained. Prior Anticoagulants: The patient has                            taken no previous anticoagulant or antiplatelet                            agents. ASA Grade Assessment: II - A patient with                            mild systemic disease. After reviewing the risks                            and benefits, the patient was deemed in                            satisfactory condition to undergo the procedure.  After obtaining informed consent, the colonoscope                            was passed under direct vision. Throughout the                            procedure, the patient's blood pressure, pulse, and                            oxygen saturations were monitored continuously. The                            Model PCF-[id number]) scope was introduced                            through the anus and  advanced to the the cecum,                            identified by appendiceal orifice and ileocecal                            valve. The colonoscopy was performed without                            difficulty. The patient tolerated the procedure                            well. The quality of the bowel preparation was                            excellent. The ileocecal valve, appendiceal                            orifice, and rectum were photographed. The quality                            of the bowel preparation was evaluated using the                            BBPS [facility] Bowel Preparation Scale) with scores                            of: Right Colon = 3, Transverse Colon = 3 and Left                            Colon = 3 (entire mucosa seen well with no residual                            staining, small fragments of stool or opaque                            liquid). The total BBPS score equals 9. The bowel  preparation used was SUPREP. Scope In: 1:57:10 PM Scope Out: 2:08:20 PM Scope Withdrawal Time: 0 hours 8 minutes 14 seconds  Total Procedure Duration: 0 hours 11 minutes 10 seconds  Findings:                 The perianal and digital rectal examinations were                            normal.                           The entire examined colon appeared normal on direct                            and retroflexion views. Complications:            No immediate complications. Estimated Blood Loss:     Estimated blood loss: none. Impression:               - The entire examined colon is normal on direct and                            retroflexion views.                           - No specimens collected. Recommendation:           - Patient has a contact number available for                            emergencies. The signs and symptoms of potential                            delayed complications were discussed with the                             patient. Return to normal activities tomorrow.                            Written discharge instructions were provided to the                            patient.                           - Resume previous diet.                           - Continue present medications.                           - Repeat colonoscopy in 10 years for screening                            purposes. Ezzie Senat L. Loletha Carrow, MD 08/03/2016 2:10:37 PM This report has been signed electronically.

## 2016-08-03 NOTE — Progress Notes (Signed)
No changes in hx since office visit.  Brother here and helped answer all questions

## 2016-08-03 NOTE — Progress Notes (Signed)
Report given to PACU, vss 

## 2016-08-04 ENCOUNTER — Telehealth: Payer: Self-pay

## 2016-08-04 NOTE — Telephone Encounter (Signed)
  Follow up Call-  Call back number 08/03/2016  Post procedure Call Back phone  # [phone]  Permission to leave phone message Yes  Some recent data might be hidden     Patient questions:  Do you have a fever, pain , or abdominal swelling? No. Pain Score  0 *  Have you tolerated food without any problems? Yes.    Have you been able to return to your normal activities? Yes.    Do you have any questions about your discharge instructions: Diet   No. Medications  No. Follow up visit  No.  Do you have questions or concerns about your Care? No.  Actions: * If pain score is 4 or above: No action needed, pain <4.

## 2016-10-08 ENCOUNTER — Telehealth: Payer: Self-pay | Admitting: Medical

## 2016-10-08 NOTE — Telephone Encounter (Signed)
Caller name: Relationship to patient: Self Can be reached: [phone]  Pharmacy:  [place], [place] - [address] [phone] (Phone) [phone] (Fax)     Reason for call: Refill memantine (NAMENDA XR) 28 MG CP24 24 hr capsule   Also would like a Rx for same medication sent to  [place], [place] [phone] (Phone) [phone] (Fax)

## 2016-10-13 MED ORDER — MEMANTINE HCL ER 28 MG PO CP24: 28.0000 mg | ORAL_CAPSULE | Freq: Every day | ORAL | 1 refills | Status: DC

## 2016-10-13 NOTE — Addendum Note (Signed)
Addended by: Hinton Dyer on: 10/13/2016 12:38 PM   Modules accepted: Orders

## 2016-10-13 NOTE — Telephone Encounter (Signed)
Rx sent to pharmacy   

## 2016-12-29 ENCOUNTER — Telehealth: Payer: Self-pay | Admitting: Physician Assistant

## 2016-12-29 ENCOUNTER — Encounter: Payer: Self-pay | Admitting: Physician Assistant

## 2016-12-29 ENCOUNTER — Telehealth: Payer: Self-pay | Admitting: Medical

## 2016-12-29 ENCOUNTER — Ambulatory Visit (INDEPENDENT_AMBULATORY_CARE_PROVIDER_SITE_OTHER): Payer: Medicare Other | Admitting: Physician Assistant

## 2016-12-29 VITALS — BP 110/78 | HR 66 | Temp 98.4°F | Resp 14 | Ht 61.0 in | Wt 121.0 lb

## 2016-12-29 DIAGNOSIS — M25552 Pain in left hip: Secondary | ICD-10-CM | POA: Diagnosis not present

## 2016-12-29 MED ORDER — MELOXICAM 7.5 MG PO TABS
7.5000 mg | ORAL_TABLET | Freq: Every day | ORAL | 0 refills | Status: DC
Start: 2016-12-29 — End: 2017-01-21

## 2016-12-29 MED FILL — MELOXICAM 7.5 MG TABLET: 7.5 | 15 days supply | Qty: 15 | Fill #0

## 2016-12-29 NOTE — Progress Notes (Signed)
Pre visit review using our clinic review tool, if applicable. No additional management support is needed unless otherwise documented below in the visit note. 

## 2016-12-29 NOTE — Telephone Encounter (Signed)
Pt would like a call when this has been done so they know to p/u

## 2016-12-29 NOTE — Progress Notes (Signed)
Patient with history of early onset Alzheimer's presents to clinic today with her caregiver (brother) who notes patient has been intermittently complaining of left hip pain over the past 1.5 weeks. Denies any known trauma or injury. Patient denies any numbness or tingling. Brother notes patient has sometimes been limping with ambulation but this is a rare occurrence. Has take Ibuprofen with some relief in symptoms.   Past Medical History:  Diagnosis Date  . Acute confusion   . Chicken pox   . Depression     Current Outpatient Prescriptions on File Prior to Visit  Medication Sig Dispense Refill  . Biotin 1 MG CAPS Take 1 capsule by mouth daily.     . Cholecalciferol (VITAMIN D-1000 MAX ST) 1000 units tablet Take 1,000 Units by mouth daily.     Marland Kitchen donepezil (ARICEPT) 10 MG tablet Take 10 mg by mouth at bedtime.   3  . escitalopram (LEXAPRO) 20 MG tablet Take 1 tablet (20 mg total) by mouth daily. 90 tablet 1  . memantine (NAMENDA XR) 28 MG CP24 24 hr capsule Take 1 capsule (28 mg total) by mouth daily. 90 capsule 1  . Multiple Vitamin (MULTIVITAMIN) tablet Take 1 tablet by mouth daily.    . simvastatin (ZOCOR) 20 MG tablet Take 1 tablet (20 mg total) by mouth at bedtime. 90 tablet 1  . fluticasone (FLONASE) 50 MCG/ACT nasal spray Place 2 sprays into both nostrils daily. (Patient not taking: Reported on 12/29/2016) 16 g 6   No current facility-administered medications on file prior to visit.     No Known Allergies  Family History  Problem Relation Age of Onset  . Diabetes Father        Deceased  . Cancer Father 75       Multiple Myeloma  . Heart defect Mother        Deceased  . Asthma Sister   . Heart defect Sister   . Heart defect Brother   . Migraines Other        Familial  . Heart attack Sister   . Healthy Brother        x4  . Healthy Sister        x2  . Alzheimer's disease Maternal Grandfather        Dementia  . Liver disease Maternal Uncle   . Healthy Son        x1    . Colon cancer Neg Hx   . Esophageal cancer Neg Hx   . Rectal cancer Neg Hx   . Stomach cancer Neg Hx     Social History   Social History  . Marital status: Widowed    Spouse name: N/A  . Number of children: N/A  . Years of education: N/A   Social History Main Topics  . Smoking status: Never Smoker  . Smokeless tobacco: Never Used  . Alcohol use Yes     Comment: rare  . Drug use: No  . Sexual activity: No   Other Topics Concern  . None   Social History Narrative  . None   Review of Systems - See HPI.  All other ROS are negative.  BP 110/78   Temp 98.4 F (36.9 C) (Oral)   Resp 14   Ht 5' 1"  (1.549 m)   Wt 121 lb (54.9 kg)   BMI 22.86 kg/m   Physical Exam  HENT:  Head: Normocephalic and atraumatic.  Cardiovascular: Normal rate and regular rhythm.   Pulmonary/Chest: Effort  normal.  Musculoskeletal:       Left hip: She exhibits tenderness. She exhibits normal range of motion, normal strength and no bony tenderness.       Lumbar back: She exhibits tenderness. She exhibits no bony tenderness and no spasm.       Left upper leg: Normal.  Examination is quite limited due to patients dementia and inability to follow instruction.   Neurological: She is alert.  Skin: Skin is warm and dry. No rash noted.   Assessment/Plan: 1. Left hip pain Atraumatic. No alarm signs/symptoms. History of and physical severely limited due to patients dementia. No bony tenderness or deformity noted. Do not see reason for imaging yet. Will start trial of Mobic and stretches. If not improving will get further assessment. Plan reviewed with patient and her brother (caregiver). Follow-up discussed.    Leeanne Rio, PA-C

## 2016-12-29 NOTE — Telephone Encounter (Signed)
Pt seen today and states rx for Meloxicam was to be sent to pharmacy.  However, it has not been sent in.  Pharmacy:  [place], [place] - [address] [phone] (Phone) [phone] (Fax)

## 2016-12-29 NOTE — Telephone Encounter (Signed)
Medication has been resent. Our apologies for any delay. Ok to call patient's caregiver.

## 2016-12-29 NOTE — Patient Instructions (Addendum)
Please take the Meloxicam once daily with food.  Use Tyenol for breakthrough pain. No heavy lifting.   Attempt to help Alexandria Oliver start the exercises below. I know you may not be successful with this. If that is the case, that is ok.   If not improving we will need imaging to further assess. Please call if symptoms are not improving over this next week.    Ask your health care provider which exercises are safe for you. Do exercises exactly as told by your health care provider and adjust them as directed. It is normal to feel mild stretching, pulling, tightness, or discomfort as you do these exercises, but you should stop right away if you feel sudden pain or your pain gets worse.Do not begin these exercises until told by your health care provider. Stretching exercises These exercises warm up your muscles and joints and improve the movement and flexibility of your hip. These exercises also help to relieve pain and stiffness. Exercise A: Iliotibial band stretch  1. Lie on your side with your left / right leg in the top position. 2. Bend your left / right knee and grab your ankle. 3. Slowly bring your knee back so your thigh is behind your body. 4. Slowly lower your knee toward the floor until you feel a gentle stretch on the outside of your left / right thigh. If you do not feel a stretch and your knee will not fall farther, place the heel of your other foot on top of your outer knee and pull your thigh down farther. 5. Hold this position for __________ seconds. 6. Slowly return to the starting position. Repeat __________ times. Complete this exercise __________ times a day. Strengthening exercises These exercises build strength and endurance in your hip and pelvis. Endurance is the ability to use your muscles for a long time, even after they get tired. Exercise B: Bridge ( hip extensors) 1. Lie on your back on a firm surface with your knees bent and your feet flat on the floor. 2. Tighten  your buttocks muscles and lift your buttocks off the floor until your trunk is level with your thighs. You should feel the muscles working in your buttocks and the back of your thighs. If this exercise is too easy, try doing it with your arms crossed over your chest. 3. Hold this position for __________ seconds. 4. Slowly return to the starting position. 5. Let your muscles relax completely between repetitions. Repeat __________ times. Complete this exercise __________ times a day. Exercise C: Squats ( knee extensors and  quadriceps) 1. Stand in front of a table, with your feet and knees pointing straight ahead. You may rest your hands on the table for balance but not for support. 2. Slowly bend your knees and lower your hips like you are going to sit in a chair. ? Keep your weight over your heels, not over your toes. ? Keep your lower legs upright so they are parallel with the table legs. ? Do not let your hips go lower than your knees. ? Do not bend lower than told by your health care provider. ? If your hip pain increases, do not bend as low. 3. Hold this position for __________ seconds. 4. Slowly push with your legs to return to standing. Do not use your hands to pull yourself to standing. Repeat __________ times. Complete this exercise __________ times a day. Exercise D: Hip hike 1. Stand sideways on a bottom step. Stand on your left /  right leg with your other foot unsupported next to the step. You can hold onto the railing or wall if needed for balance. 2. Keeping your knees straight and your torso square, lift your left / right hip up toward the ceiling. 3. Hold this position for __________ seconds. 4. Slowly let your left / right hip lower toward the floor, past the starting position. Your foot should get closer to the floor. Do not lean or bend your knees. Repeat __________ times. Complete this exercise __________ times a day. Exercise E: Single leg stand 1. Stand near a counter or  door frame that you can hold onto for balance as needed. It is helpful to stand in front of a mirror for this exercise so you can watch your hip. 2. Squeeze your left / right buttock muscles then lift up your other foot. Do not let your left / right hip push out to the side. 3. Hold this position for __________ seconds. Repeat __________ times. Complete this exercise __________ times a day. This information is not intended to replace advice given to you by your health care provider. Make sure you discuss any questions you have with your health care provider. Document Released: 06/11/2004 Document Revised: 01/09/2016 Document Reviewed: 04/19/2015 Elsevier Interactive Patient Education  [employer].

## 2016-12-29 NOTE — Telephone Encounter (Signed)
Pt would like to transfer care from [facility] to [facility], please advise ok to switch providers.

## 2016-12-29 NOTE — Telephone Encounter (Signed)
Ok to transfer. Will only be addressing acutes at visit today. Will need to schedule a follow-up appointment for me to take back over chronic medication.

## 2016-12-29 NOTE — Telephone Encounter (Signed)
Ok to switch to [facility].

## 2016-12-29 NOTE — Telephone Encounter (Signed)
Pt brother has been made aware that Rx has been called into pharmacy.

## 2017-01-14 ENCOUNTER — Other Ambulatory Visit: Payer: Self-pay | Admitting: Medical

## 2017-01-14 ENCOUNTER — Telehealth: Payer: Self-pay | Admitting: Neurology

## 2017-01-14 NOTE — Telephone Encounter (Signed)
She will need her Namenda medication refilled before she will see Dr. Tomi Likens in November. She uses [facility]. Please call to let him know that it has been called in. Thanks

## 2017-01-15 ENCOUNTER — Other Ambulatory Visit: Payer: Self-pay | Admitting: Physician Assistant

## 2017-01-15 MED FILL — NAMENDA XR 28 MG CAPSULE: 28 | 30 days supply | Qty: 30 | Fill #0

## 2017-01-15 NOTE — Telephone Encounter (Signed)
I haven't seen this patient in over 2 1/2 years.  She has an upcoming appointment in November.  Until then, medication should be filled by her PCP.

## 2017-01-15 NOTE — Telephone Encounter (Signed)
Pt brother calling asking for refill on namenda for a 30 day supply, asking this be sent to [facility] and the rest of Rx (of the 90 day supply[facility] Please call when this has been sent to local pharmacy.

## 2017-01-15 NOTE — Telephone Encounter (Signed)
Please advise pt saw cody last (12/2016- hip pain) namenda was last filled by [facility] on 10/13/16 #90 with 1

## 2017-01-15 NOTE — Telephone Encounter (Signed)
30 day supply filled to the pharmacy. Please advise on the 90 day mail order.

## 2017-01-15 NOTE — Telephone Encounter (Signed)
Pt is rqsting Namenda refill for mail order 90 day supply, has not been seen since 05/28/14, f/u ov is in Nov...ok to refill?

## 2017-01-15 NOTE — Telephone Encounter (Signed)
LM for Pt advising unable to fill rqst, will need to get from PCP until appt here in Nov

## 2017-01-15 NOTE — Telephone Encounter (Signed)
[place] for #30 to local pharmacy, will defer to PCP for 90 day refill

## 2017-01-16 NOTE — Telephone Encounter (Signed)
Ok to send in 37-months to mail order pharmacy.

## 2017-01-19 MED ORDER — MEMANTINE HCL ER 28 MG PO CP24: 28.0000 mg | ORAL_CAPSULE | Freq: Every day | ORAL | 1 refills | Status: DC

## 2017-01-21 ENCOUNTER — Telehealth: Payer: Self-pay | Admitting: Physician Assistant

## 2017-01-21 ENCOUNTER — Ambulatory Visit (INDEPENDENT_AMBULATORY_CARE_PROVIDER_SITE_OTHER): Payer: Medicare Other | Admitting: Physician Assistant

## 2017-01-21 ENCOUNTER — Ambulatory Visit (INDEPENDENT_AMBULATORY_CARE_PROVIDER_SITE_OTHER): Payer: Medicare Other

## 2017-01-21 ENCOUNTER — Encounter: Payer: Self-pay | Admitting: Physician Assistant

## 2017-01-21 VITALS — BP 108/70 | HR 76 | Temp 98.7°F | Resp 14 | Ht 61.0 in | Wt 122.0 lb

## 2017-01-21 DIAGNOSIS — G8929 Other chronic pain: Secondary | ICD-10-CM

## 2017-01-21 DIAGNOSIS — M25552 Pain in left hip: Secondary | ICD-10-CM

## 2017-01-21 DIAGNOSIS — M19012 Primary osteoarthritis, left shoulder: Secondary | ICD-10-CM | POA: Diagnosis not present

## 2017-01-21 DIAGNOSIS — E785 Hyperlipidemia, unspecified: Secondary | ICD-10-CM | POA: Diagnosis not present

## 2017-01-21 LAB — HEPATIC FUNCTION PANEL
ALT: 36 U/L — ABNORMAL HIGH (ref 0–35)
AST: 25 U/L (ref 0–37)
Albumin: 4.5 g/dL (ref 3.5–5.2)
Alkaline Phosphatase: 67 U/L (ref 39–117)
BILIRUBIN DIRECT: 0.1 mg/dL (ref 0.0–0.3)
BILIRUBIN TOTAL: 0.7 mg/dL (ref 0.2–1.2)
Total Protein: 7.2 g/dL (ref 6.0–8.3)

## 2017-01-21 MED ORDER — ESCITALOPRAM OXALATE 20 MG PO TABS: 20.0000 mg | ORAL_TABLET | Freq: Every day | ORAL | 1 refills | Status: DC

## 2017-01-21 MED ORDER — SIMVASTATIN 20 MG PO TABS: 20.0000 mg | ORAL_TABLET | Freq: Every day | ORAL | 1 refills | Status: DC

## 2017-01-21 MED ORDER — MELOXICAM 7.5 MG PO TABS: 7.5000 mg | ORAL_TABLET | Freq: Every day | ORAL | 0 refills | Status: DC

## 2017-01-21 MED FILL — MELOXICAM 7.5 MG TABLET: 7.5 | 30 days supply | Qty: 30 | Fill #0

## 2017-01-21 NOTE — Progress Notes (Signed)
Pre visit review using our clinic review tool, if applicable. No additional management support is needed unless otherwise documented below in the visit note. 

## 2017-01-21 NOTE — Telephone Encounter (Signed)
Someone left message on voice mail asking for a call back regarding a call report on pt. They did not leave their name or where they were calling from, but asked that we return call [phone]

## 2017-01-21 NOTE — Progress Notes (Deleted)
    Patient presents to clinic today for annual exam.  Patient is fasting for labs.  Acute Concerns:   Chronic Issues:   Health Maintenance: Immunizations -- Colonoscopy -- Mammogram -- PAP -- Bone Density --  Past Medical History:  Diagnosis Date  . Acute confusion   . Chicken pox   . Depression     Past Surgical History:  Procedure Laterality Date  . Unremarkable.      Current Outpatient Prescriptions on File Prior to Visit  Medication Sig Dispense Refill  . Biotin 1 MG CAPS Take 1 capsule by mouth daily.     . Cholecalciferol (VITAMIN D-1000 MAX ST) 1000 units tablet Take 1,000 Units by mouth daily.     Marland Kitchen donepezil (ARICEPT) 10 MG tablet Take 10 mg by mouth at bedtime.   3  . escitalopram (LEXAPRO) 20 MG tablet Take 1 tablet (20 mg total) by mouth daily. 90 tablet 1  . fluticasone (FLONASE) 50 MCG/ACT nasal spray Place 2 sprays into both nostrils daily. (Patient not taking: Reported on 12/29/2016) 16 g 6  . meloxicam (MOBIC) 7.5 MG tablet Take 1 tablet (7.5 mg total) by mouth daily. 15 tablet 0  . memantine (NAMENDA XR) 28 MG CP24 24 hr capsule Take 1 capsule (28 mg total) by mouth daily. 90 capsule 1  . Multiple Vitamin (MULTIVITAMIN) tablet Take 1 tablet by mouth daily.    . simvastatin (ZOCOR) 20 MG tablet Take 1 tablet (20 mg total) by mouth at bedtime. 90 tablet 1   No current facility-administered medications on file prior to visit.     No Known Allergies  Family History  Problem Relation Age of Onset  . Diabetes Father        Deceased  . Cancer Father 14       Multiple Myeloma  . Heart defect Mother        Deceased  . Asthma Sister   . Heart defect Sister   . Heart defect Brother   . Migraines Other        Familial  . Heart attack Sister   . Healthy Brother        x4  . Healthy Sister        x2  . Alzheimer's disease Maternal Grandfather        Dementia  . Liver disease Maternal Uncle   . Healthy Son        x1  . Colon cancer Neg Hx   .  Esophageal cancer Neg Hx   . Rectal cancer Neg Hx   . Stomach cancer Neg Hx     Social History   Social History  . Marital status: Widowed    Spouse name: N/A  . Number of children: N/A  . Years of education: N/A   Occupational History  . Not on file.   Social History Main Topics  . Smoking status: Never Smoker  . Smokeless tobacco: Never Used  . Alcohol use Yes     Comment: rare  . Drug use: No  . Sexual activity: No   Other Topics Concern  . Not on file   Social History Narrative  . No narrative on file    ROS  There were no vitals taken for this visit.  Physical Exam  No results found for this or any previous visit (from the past 2160 hour(s)).  Assessment/Plan: No problem-specific Assessment & Plan notes found for this encounter.    Leeanne Rio, PA-C

## 2017-01-21 NOTE — Patient Instructions (Signed)
Please go to the lab for blood work. I will call you with your results.  Please continue chronic medications as directed.  Please go to the [facility] for your x-ray. Levada Dy will give you the address. Restart Meloxicam once daily with food. We will alter regimen based on results.  Follow-up in December for wellness visit.

## 2017-01-21 NOTE — Telephone Encounter (Signed)
Spoke with [facility] - they wanted to make sure that provider had the DG report, and specifically asked that I make note that the radiologist wanted him to pay close attention to the impression listed.    Routed to provider so that he is aware.

## 2017-01-21 NOTE — Progress Notes (Signed)
Patient presents to clinic today to discuss left hip pain. At last visit patient was started on Meloxicam 7.5 mg daily. Noted improvement in symptoms with this regimen. Once completed, symptoms return. Denies back pain. Denies radiation of pain into extremities. Denies numbness, tingling or weakness.   Past Medical History:  Diagnosis Date  . Acute confusion   . Chicken pox   . Depression     Current Outpatient Prescriptions on File Prior to Visit  Medication Sig Dispense Refill  . Biotin 1 MG CAPS Take 1 capsule by mouth daily.     . Cholecalciferol (VITAMIN D-1000 MAX ST) 1000 units tablet Take 1,000 Units by mouth daily.     Marland Kitchen donepezil (ARICEPT) 10 MG tablet Take 10 mg by mouth at bedtime.   3  . memantine (NAMENDA XR) 28 MG CP24 24 hr capsule Take 1 capsule (28 mg total) by mouth daily. 90 capsule 1  . Multiple Vitamin (MULTIVITAMIN) tablet Take 1 tablet by mouth daily.     No current facility-administered medications on file prior to visit.     No Known Allergies  Family History  Problem Relation Age of Onset  . Diabetes Father        Deceased  . Cancer Father 70       Multiple Myeloma  . Heart defect Mother        Deceased  . Asthma Sister   . Heart defect Sister   . Heart defect Brother   . Migraines Other        Familial  . Heart attack Sister   . Healthy Brother        x4  . Healthy Sister        x2  . Alzheimer's disease Maternal Grandfather        Dementia  . Liver disease Maternal Uncle   . Healthy Son        x1  . Colon cancer Neg Hx   . Esophageal cancer Neg Hx   . Rectal cancer Neg Hx   . Stomach cancer Neg Hx     Social History   Social History  . Marital status: Widowed    Spouse name: N/A  . Number of children: N/A  . Years of education: N/A   Social History Main Topics  . Smoking status: Never Smoker  . Smokeless tobacco: Never Used  . Alcohol use Yes     Comment: rare  . Drug use: No  . Sexual activity: No   Other Topics  Concern  . None   Social History Narrative  . None   Review of Systems - See HPI.  All other ROS are negative.  BP 108/70   Pulse 76   Temp 98.7 F (37.1 C) (Oral)   Resp 14   Ht _0  (1.549 m)   Wt 122 lb (55.3 kg)   SpO2 98%   BMI 23.05 kg/m   Physical Exam  Constitutional: She is well-developed, well-nourished, and in no distress.  HENT:  Head: Normocephalic and atraumatic.  Cardiovascular: Normal rate, regular rhythm, normal heart sounds and intact distal pulses.   Pulmonary/Chest: Effort normal and breath sounds normal. No respiratory distress. She has no wheezes. She has no rales. She exhibits no tenderness.  Musculoskeletal:       Left hip: She exhibits normal range of motion, normal strength, no tenderness and no bony tenderness.       Lumbar back: Normal.  Neurological: She is alert.  Skin: Skin is warm and dry. No rash noted.  Psychiatric: Affect normal.  Vitals reviewed.  Assessment/Plan: 1. Chronic left hip pain Will obtain x-ray to further assess. Exam unremarkable. Suspect arthritic. Will restart Meloxicam. May need to consider PT.  - DG HIP UNILAT W OR W/O PELVIS 2-3 VIEWS LEFT; Future  2. Hyperlipidemia, unspecified hyperlipidemia type Repeat LFT today. - Hepatic function panel   Leeanne Rio, PA-C

## 2017-01-21 NOTE — Telephone Encounter (Signed)
I have reviewed the report and definitely note the concerning findings. Will call patient tomorrow when in office to discuss these findings with her and her caregiver (brother) along with next steps.

## 2017-01-22 ENCOUNTER — Other Ambulatory Visit: Payer: Self-pay | Admitting: Physician Assistant

## 2017-01-22 DIAGNOSIS — C9000 Multiple myeloma not having achieved remission: Secondary | ICD-10-CM

## 2017-01-26 ENCOUNTER — Encounter: Payer: Self-pay | Admitting: Neurology

## 2017-01-26 ENCOUNTER — Ambulatory Visit (INDEPENDENT_AMBULATORY_CARE_PROVIDER_SITE_OTHER): Payer: Medicare Other | Admitting: Neurology

## 2017-01-26 VITALS — BP 118/72 | HR 70 | Ht 60.0 in | Wt 122.4 lb

## 2017-01-26 DIAGNOSIS — G300 Alzheimer's disease with early onset: Secondary | ICD-10-CM | POA: Diagnosis not present

## 2017-01-26 DIAGNOSIS — F0280 Dementia in other diseases classified elsewhere without behavioral disturbance: Secondary | ICD-10-CM | POA: Diagnosis not present
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
## 2017-01-26 NOTE — Patient Instructions (Addendum)
1.  Continue donepezil 10mg  daily and Namenda XR 28mg  daily 2.  Continue escitalopram 3.  Follow up in 6 months.   RESOURCES: Development worker, community of [facility]: [phone]  Tel [place]:  [phone]  www.senior-resources-[place].org/resources.cfm   Resources for common questions found under "Pathways & Protocols "  www.senior-resources-[place].org/pathways/Pathways_Menu.htm   Resources for [facility] facilities:  www.ncnursinghomeguide.com   Alzheimer's Association: Website: LDLive.be Telephone:  (484)396-2304  For assistance with senior care, elder law, and estate planning (POA, medical directives):  [employer]  [address], [place] [address]  Tel: [phone]  [email or web address]   Berneice Heinrich  Tel: [phone]  [email or web address]

## 2017-01-28 ENCOUNTER — Other Ambulatory Visit: Payer: Self-pay | Admitting: Hematology & Oncology

## 2017-01-28 DIAGNOSIS — D472 Monoclonal gammopathy: Secondary | ICD-10-CM

## 2017-01-29 ENCOUNTER — Ambulatory Visit (HOSPITAL_BASED_OUTPATIENT_CLINIC_OR_DEPARTMENT_OTHER)
Admission: RE | Admit: 2017-01-29 | Discharge: 2017-01-29 | Disposition: A | Discharge status: Home / Self Care | Payer: Medicare Other | Source: Ambulatory Visit | Attending: Hematology & Oncology | Admitting: Hematology & Oncology

## 2017-01-29 ENCOUNTER — Ambulatory Visit (HOSPITAL_BASED_OUTPATIENT_CLINIC_OR_DEPARTMENT_OTHER): Payer: Medicare Other | Admitting: Hematology & Oncology

## 2017-01-29 ENCOUNTER — Other Ambulatory Visit (HOSPITAL_BASED_OUTPATIENT_CLINIC_OR_DEPARTMENT_OTHER): Payer: Medicare Other

## 2017-01-29 VITALS — BP 125/75 | HR 67 | Temp 98.2°F | Resp 18 | Wt 121.0 lb

## 2017-01-29 DIAGNOSIS — M899 Disorder of bone, unspecified: Secondary | ICD-10-CM

## 2017-01-29 DIAGNOSIS — R1900 Intra-abdominal and pelvic swelling, mass and lump, unspecified site: Secondary | ICD-10-CM | POA: Diagnosis not present

## 2017-01-29 DIAGNOSIS — D472 Monoclonal gammopathy: Secondary | ICD-10-CM

## 2017-01-29 LAB — CBC WITH DIFFERENTIAL (CANCER CENTER ONLY)
BASO#: 0 10*3/uL (ref 0.0–0.2)
BASO%: 0.2 % (ref 0.0–2.0)
EOS%: 2.9 % (ref 0.0–7.0)
Eosinophils Absolute: 0.1 10*3/uL (ref 0.0–0.5)
HCT: 37.6 % (ref 34.8–46.6)
HEMOGLOBIN: 12.4 g/dL (ref 11.6–15.9)
LYMPH#: 2.1 10*3/uL (ref 0.9–3.3)
LYMPH%: 45.2 % (ref 14.0–48.0)
MCH: 31.2 pg (ref 26.0–34.0)
MCHC: 33 g/dL (ref 32.0–36.0)
MCV: 95 fL (ref 81–101)
MONO#: 0.3 10*3/uL (ref 0.1–0.9)
MONO%: 6.4 % (ref 0.0–13.0)
NEUT#: 2.1 10*3/uL (ref 1.5–6.5)
NEUT%: 45.3 % (ref 39.6–80.0)
PLATELETS: 208 10*3/uL (ref 145–400)
RBC: 3.97 10*6/uL (ref 3.70–5.32)
RDW: 11.2 % (ref 11.1–15.7)
WBC: 4.6 10*3/uL (ref 3.9–10.0)

## 2017-01-29 LAB — CMP (CANCER CENTER ONLY)
ALT(SGPT): 65 U/L — ABNORMAL HIGH (ref 10–47)
AST: 35 U/L (ref 11–38)
Albumin: 3.7 g/dL (ref 3.3–5.5)
Alkaline Phosphatase: 94 U/L — ABNORMAL HIGH (ref 26–84)
BUN: 19 mg/dL (ref 7–22)
CO2: 28 meq/L (ref 18–33)
Calcium: 9.4 mg/dL (ref 8.0–10.3)
Chloride: 106 mEq/L (ref 98–108)
Creat: 0.8 mg/dl (ref 0.6–1.2)
Glucose, Bld: 117 mg/dL (ref 73–118)
POTASSIUM: 3.8 meq/L (ref 3.3–4.7)
SODIUM: 143 meq/L (ref 128–145)
Total Bilirubin: 1.2 mg/dl (ref 0.20–1.60)
Total Protein: 7.5 g/dL (ref 6.4–8.1)

## 2017-01-29 LAB — LACTATE DEHYDROGENASE: LDH: 196 U/L (ref 125–245)

## 2017-01-29 LAB — CHCC SATELLITE - SMEAR

## 2017-01-29 NOTE — Progress Notes (Signed)
Referral MD  Reason for Referral: Lytic lesions on left hip   Chief Complaint  Patient presents with  . New Patient (Initial Visit)  : Patient cannot give any history secondary to early onset Alzheimer's disease.  HPI: Alexandria Oliver is a very charming 61 year old Filipino woman. She has been in the [place] since 1993.  Unfortunately, she has early onset Alzheimer's dementia. Thankfully, her brother, who she lives with, is with her.  From what I can tell, she has been having some pain in the left hip. This been going on for a couple months. She was placed on meloxicam.  She did have some x-rays done. This was done on September 6. X-rays show some osteoporosis. However, there were some small lucent foci throughout the bony structures.  The last set of blood work that I have on her was September 6. This showed a total protein of 7.2 and albumin of 4.5. Her liver tests looked okay.  Going back to December 2017, her total protein was 7.9 with an albumin of 4.7. Her calcium was 9.9. Her creatinine was 0.69.  She has not lost weight. She is eating okay. She has had no problems with tobacco use. There's no alcohol use.  There is no trauma that is reported.  She has no history of malignancy in the family, although her father had myeloma. He lived in the [place]. They're not sure how he passed on.  She is up-to-date with her mammogram.  Her brother is a Marine scientist over at [employer]. He definitely understands what needs to be done.  She has had no prior surgery.  She does not have any children.  Overall, her performance status is ECOG 2.    Past Medical History:  Diagnosis Date  . Acute confusion   . Chicken pox   . Depression   :  Past Surgical History:  Procedure Laterality Date  . Unremarkable.    :   Current Outpatient Prescriptions:  .  Biotin 1 MG CAPS, Take 1 capsule by mouth daily. , Disp: , Rfl:  .  Cholecalciferol (VITAMIN D-1000 MAX ST) 1000 units tablet,  Take 1,000 Units by mouth daily. , Disp: , Rfl:  .  donepezil (ARICEPT) 10 MG tablet, Take 10 mg by mouth at bedtime. , Disp: , Rfl: 3 .  escitalopram (LEXAPRO) 20 MG tablet, Take 1 tablet (20 mg total) by mouth daily., Disp: 90 tablet, Rfl: 1 .  meloxicam (MOBIC) 7.5 MG tablet, Take 1 tablet (7.5 mg total) by mouth daily., Disp: 30 tablet, Rfl: 0 .  memantine (NAMENDA XR) 28 MG CP24 24 hr capsule, Take 1 capsule (28 mg total) by mouth daily., Disp: 90 capsule, Rfl: 1 .  Multiple Vitamin (MULTIVITAMIN) tablet, Take 1 tablet by mouth daily., Disp: , Rfl:  .  simvastatin (ZOCOR) 20 MG tablet, Take 1 tablet (20 mg total) by mouth at bedtime., Disp: 90 tablet, Rfl: 1:  :  No Known Allergies:  Family History  Problem Relation Age of Onset  . Diabetes Father        Deceased  . Cancer Father 55       Multiple Myeloma  . Heart defect Mother        Deceased  . Asthma Sister   . Heart defect Sister   . Heart defect Brother   . Migraines Other        Familial  . Heart attack Sister   . Healthy Brother  x4  . Healthy Sister        x2  . Alzheimer's disease Maternal Grandfather        Dementia  . Liver disease Maternal Uncle   . Healthy Son        x1  . Colon cancer Neg Hx   . Esophageal cancer Neg Hx   . Rectal cancer Neg Hx   . Stomach cancer Neg Hx   :  Social History   Social History  . Marital status: Widowed    Spouse name: N/A  . Number of children: N/A  . Years of education: N/A   Occupational History  . Not on file.   Social History Main Topics  . Smoking status: Never Smoker  . Smokeless tobacco: Never Used  . Alcohol use No     Comment: rare  . Drug use: No  . Sexual activity: No   Other Topics Concern  . Not on file   Social History Narrative  . No narrative on file  :  Pertinent items are noted in HPI.  Exam:Well-developed and well-nourished Filipino female in no obvious distress. Vital signs show a temperature of 98.2. Pulse 67. Blood  pressure 125/75. Weight is 121 pounds. Head neck exam shows no ocular or oral lesions. She has no palpable cervical or supraclavicular lymph nodes. Lungs are clear bilaterally. Cardiac exam regular rate and rhythm with no murmurs, rubs or bruits. Abdomen is soft. She has good bowel sounds. There is no fluid wave. There is no palpable liver or spleen tip. Back exam shows no tenderness over the spine, ribs or hips. She has good range of motion of the left hip. There is no tenderness to deep palpation of the left hip. Extremities shows no clubbing, cyanosis or edema. Neurological exam shows no focal neurological deficits.  Skin exam shows no rashes, ecchymoses or petechia.    Recent Labs  01/29/17 1349  WBC 4.6  HGB 12.4  HCT 37.6  PLT 208    Recent Labs  01/29/17 1349  NA 143  K 3.8  CL 106  CO2 28  GLUCOSE 117  BUN 19  CREATININE 0.8  CALCIUM 9.4    Blood smear review: Normochromic and normocytic population of red blood cells. There are no nucleated red blood cells. I see no teardrop cells. There is no rouleau formation. White cells been normal in morphology maturation. There is no immature myeloid or lymphoid forms. There are no hyper segmented polys. I see no inclusion bodies. She has no plasmacytoid cells. Platelets are adequate in number and size.   Pathology: None     Assessment and Plan:  Alexandria Oliver is a 61 year old Filipino woman. She has early onset Alzheimer's dementia. She is very pleasant.  I am very skeptical about her having myeloma. Her labs Cerner do not support this unless she has light chain disease. Typically, with light chain disease, protein and albumin will both be low.  I do not see any hypercalcemia. She has normal renal function. She is not anemic.  We will have to see what her myeloma studies show.  We will get a bone survey on her today. This definitely can help [place] out. If the bone survey shows lytic lesions elsewhere, then we might consider her for a  bone marrow biopsy. I might also consider a MRI or PET scan.  There is the family history of myeloma. This we have to take into consideration.  It is possible that she may have non-secretory  myeloma. Again, normal way we would diagnosis's with a bone marrow biopsy.  I spent about 45 minutes with she and her brother. Again, he is a Marine scientist. He has a very good understanding of the situation and what might need to be done.  I answered all of his questions.  We will plan to get him back in our office depending on low we find with our x-ray studies.

## 2017-02-01 LAB — PROTEIN ELECTROPHORESIS, SERUM, WITH REFLEX
A/G Ratio: 1.1 (ref 0.7–1.7)
ALBUMIN: 3.9 g/dL (ref 2.9–4.4)
Alpha 1: 0.2 g/dL (ref 0.0–0.4)
Alpha 2: 0.7 g/dL (ref 0.4–1.0)
Beta: 1.3 g/dL (ref 0.7–1.3)
Gamma Globulin: 1.3 g/dL (ref 0.4–1.8)
Globulin, Total: 3.5 g/dL (ref 2.2–3.9)
Total Protein: 7.4 g/dL (ref 6.0–8.5)

## 2017-02-01 LAB — BETA 2 MICROGLOBULIN, SERUM: BETA 2: 2 mg/L (ref 0.6–2.4)

## 2017-02-01 LAB — IGG, IGA, IGM
IGM (IMMUNOGLOBIN M), SRM: 59 mg/dL (ref 26–217)
IgA, Qn, Serum: 385 mg/dL — ABNORMAL HIGH (ref 87–352)

## 2017-02-01 LAB — KAPPA/LAMBDA LIGHT CHAINS
IG KAPPA FREE LIGHT CHAIN: 18.3 mg/L (ref 3.3–19.4)
Ig Lambda Free Light Chain: 20.7 mg/L (ref 5.7–26.3)
Kappa/Lambda FluidC Ratio: 0.88 (ref 0.26–1.65)

## 2017-02-16 ENCOUNTER — Other Ambulatory Visit: Payer: Self-pay | Admitting: Hematology & Oncology

## 2017-02-16 DIAGNOSIS — M899 Disorder of bone, unspecified: Secondary | ICD-10-CM

## 2017-02-28 ENCOUNTER — Other Ambulatory Visit: Payer: Self-pay | Admitting: Radiology

## 2017-03-02 ENCOUNTER — Ambulatory Visit (HOSPITAL_COMMUNITY)
Admission: RE | Admit: 2017-03-02 | Discharge: 2017-03-02 | Disposition: A | Discharge status: Home / Self Care | Payer: Medicare Other | Source: Ambulatory Visit | Attending: Hematology & Oncology | Admitting: Hematology & Oncology

## 2017-03-02 ENCOUNTER — Other Ambulatory Visit: Payer: Self-pay | Admitting: Student

## 2017-03-02 ENCOUNTER — Encounter (HOSPITAL_COMMUNITY): Payer: Self-pay

## 2017-03-02 DIAGNOSIS — F0280 Dementia in other diseases classified elsewhere without behavioral disturbance: Secondary | ICD-10-CM | POA: Diagnosis not present

## 2017-03-02 DIAGNOSIS — D72822 Plasmacytosis: Secondary | ICD-10-CM | POA: Diagnosis not present

## 2017-03-02 DIAGNOSIS — M25552 Pain in left hip: Secondary | ICD-10-CM | POA: Insufficient documentation

## 2017-03-02 DIAGNOSIS — Z79899 Other long term (current) drug therapy: Secondary | ICD-10-CM | POA: Insufficient documentation

## 2017-03-02 DIAGNOSIS — Z807 Family history of other malignant neoplasms of lymphoid, hematopoietic and related tissues: Secondary | ICD-10-CM | POA: Insufficient documentation

## 2017-03-02 DIAGNOSIS — Z825 Family history of asthma and other chronic lower respiratory diseases: Secondary | ICD-10-CM | POA: Diagnosis not present

## 2017-03-02 DIAGNOSIS — M898X8 Other specified disorders of bone, other site: Secondary | ICD-10-CM | POA: Diagnosis not present

## 2017-03-02 DIAGNOSIS — Z8249 Family history of ischemic heart disease and other diseases of the circulatory system: Secondary | ICD-10-CM | POA: Diagnosis not present

## 2017-03-02 DIAGNOSIS — G300 Alzheimer's disease with early onset: Secondary | ICD-10-CM | POA: Diagnosis not present

## 2017-03-02 DIAGNOSIS — M899 Disorder of bone, unspecified: Secondary | ICD-10-CM | POA: Insufficient documentation

## 2017-03-02 DIAGNOSIS — F329 Major depressive disorder, single episode, unspecified: Secondary | ICD-10-CM | POA: Diagnosis not present

## 2017-03-02 DIAGNOSIS — Z833 Family history of diabetes mellitus: Secondary | ICD-10-CM | POA: Diagnosis not present

## 2017-03-02 DIAGNOSIS — D759 Disease of blood and blood-forming organs, unspecified: Secondary | ICD-10-CM | POA: Diagnosis not present

## 2017-03-02 LAB — CBC WITH DIFFERENTIAL/PLATELET
BASOS PCT: 0 %
Basophils Absolute: 0 10*3/uL (ref 0.0–0.1)
Eosinophils Absolute: 0.1 10*3/uL (ref 0.0–0.7)
Eosinophils Relative: 2 %
HEMATOCRIT: 40.9 % (ref 36.0–46.0)
HEMOGLOBIN: 13.6 g/dL (ref 12.0–15.0)
LYMPHS ABS: 1.9 10*3/uL (ref 0.7–4.0)
LYMPHS PCT: 36 %
MCH: 30.8 pg (ref 26.0–34.0)
MCHC: 33.3 g/dL (ref 30.0–36.0)
MCV: 92.7 fL (ref 78.0–100.0)
MONO ABS: 0.4 10*3/uL (ref 0.1–1.0)
MONOS PCT: 7 %
NEUTROS ABS: 2.9 10*3/uL (ref 1.7–7.7)
Neutrophils Relative %: 55 %
Platelets: 207 10*3/uL (ref 150–400)
RBC: 4.41 MIL/uL (ref 3.87–5.11)
RDW: 12.2 % (ref 11.5–15.5)
WBC: 5.3 10*3/uL (ref 4.0–10.5)

## 2017-03-02 LAB — BASIC METABOLIC PANEL
ANION GAP: 8 (ref 5–15)
BUN: 11 mg/dL (ref 6–20)
CALCIUM: 9.5 mg/dL (ref 8.9–10.3)
CHLORIDE: 103 mmol/L (ref 101–111)
CO2: 28 mmol/L (ref 22–32)
Creatinine, Ser: 0.71 mg/dL (ref 0.44–1.00)
GFR calc Af Amer: >60 mL/min (ref >60)
GFR calc non Af Amer: >60 mL/min (ref >60)
GLUCOSE: 118 mg/dL — AB (ref 65–99)
Potassium: 4 mmol/L (ref 3.5–5.1)
Sodium: 139 mmol/L (ref 135–145)

## 2017-03-02 LAB — PROTIME-INR
INR: 0.96
Prothrombin Time: 12.7 seconds (ref 11.4–15.2)

## 2017-03-02 MED ORDER — MIDAZOLAM HCL 2 MG/2ML IJ SOLN
INTRAMUSCULAR | Status: AC | PRN
Start: 2017-03-02 — End: 2017-03-02
  Administered 2017-03-02: 1 mg via INTRAVENOUS
  Administered 2017-03-02: 0.5 mg via INTRAVENOUS

## 2017-03-02 MED ORDER — FENTANYL CITRATE (PF) 100 MCG/2ML IJ SOLN
INTRAMUSCULAR | Status: AC | PRN
  Administered 2017-03-02: 25 ug via INTRAVENOUS
  Administered 2017-03-02: 50 ug via INTRAVENOUS

## 2017-03-02 MED ORDER — SODIUM CHLORIDE 0.9 % IV SOLN
INTRAVENOUS | Status: DC
  Administered 2017-03-02: 09:00:00 via INTRAVENOUS

## 2017-03-02 MED ORDER — FENTANYL CITRATE (PF) 100 MCG/2ML IJ SOLN
INTRAMUSCULAR | Status: AC
  Filled 2017-03-02: qty 4

## 2017-03-02 MED ORDER — HYDROCODONE-ACETAMINOPHEN 5-325 MG PO TABS: 1.0000 | ORAL_TABLET | ORAL | Status: DC | PRN

## 2017-03-02 MED ORDER — MIDAZOLAM HCL 2 MG/2ML IJ SOLN
INTRAMUSCULAR | Status: AC
  Filled 2017-03-02: qty 4

## 2017-03-02 NOTE — Procedures (Signed)
Interventional Radiology Procedure Note  Procedure: CT guided aspirate and core biopsy of right iliac bone Complications: None Recommendations: - Bedrest supine x 1 hrs - Hydrocodone PRN  Pain - Follow biopsy results  Signed,  Heath K. McCullough, MD   

## 2017-03-02 NOTE — Discharge Instructions (Signed)
Moderate Conscious Sedation, Adult, Care After °These instructions provide you with information about caring for yourself after your procedure. Your health care provider may also give you more specific instructions. Your treatment has been planned according to current medical practices, but problems sometimes occur. Call your health care provider if you have any problems or questions after your procedure. °What can I expect after the procedure? °After your procedure, it is common: °· To feel sleepy for several hours. °· To feel clumsy and have poor balance for several hours. °· To have poor judgment for several hours. °· To vomit if you eat too soon. ° °Follow these instructions at home: °For at least 24 hours after the procedure: ° °· Do not: °? Participate in activities where you could fall or become injured. °? Drive. °? Use heavy machinery. °? Drink alcohol. °? Take sleeping pills or medicines that cause drowsiness. °? Make important decisions or sign legal documents. °? Take care of children on your own. °· Rest. °Eating and drinking °· Follow the diet recommended by your health care provider. °· If you vomit: °? Drink water, juice, or soup when you can drink without vomiting. °? Make sure you have little or no nausea before eating solid foods. °General instructions °· Have a responsible adult stay with you until you are awake and alert. °· Take over-the-counter and prescription medicines only as told by your health care provider. °· If you smoke, do not smoke without supervision. °· Keep all follow-up visits as told by your health care provider. This is important. °Contact a health care provider if: °· You keep feeling nauseous or you keep vomiting. °· You feel light-headed. °· You develop a rash. °· You have a fever. °Get help right away if: °· You have trouble breathing. °This information is not intended to replace advice given to you by your health care provider. Make sure you discuss any questions you have  with your health care provider. °Document Released: 02/22/2013 Document Revised: 10/07/2015 Document Reviewed: 08/24/2015 °Elsevier Interactive Patient Education © 2018 Elsevier Inc. ° ° °Bone Marrow Aspiration and Bone Marrow Biopsy, Adult, Care After °This sheet gives you information about how to care for yourself after your procedure. Your health care provider may also give you more specific instructions. If you have problems or questions, contact your health care provider. °What can I expect after the procedure? °After the procedure, it is common to have: °· Mild pain and tenderness. °· Swelling. °· Bruising. ° °Follow these instructions at home: °· Take over-the-counter or prescription medicines only as told by your health care provider. °· Do not take baths, swim, or use a hot tub until your health care provider approves. Ask if you can take a shower or have a sponge bath. °· Follow instructions from your health care provider about how to take care of the puncture site. Make sure you: °? Wash your hands with soap and water before you change your bandage (dressing). If soap and water are not available, use hand sanitizer. °? Change your dressing as told by your health care provider. °· Check your puncture site every day for signs of infection. Check for: °? More redness, swelling, or pain. °? More fluid or blood. °? Warmth. °? Pus or a bad smell. °· Return to your normal activities as told by your health care provider. Ask your health care provider what activities are safe for you. °· Do not drive for 24 hours if you were given a medicine to help you   relax (sedative). °· Keep all follow-up visits as told by your health care provider. This is important. °Contact a health care provider if: °· You have more redness, swelling, or pain around the puncture site. °· You have more fluid or blood coming from the puncture site. °· Your puncture site feels warm to the touch. °· You have pus or a bad smell coming from the  puncture site. °· You have a fever. °· Your pain is not controlled with medicine. °This information is not intended to replace advice given to you by your health care provider. Make sure you discuss any questions you have with your health care provider. °Document Released: 11/21/2004 Document Revised: 11/22/2015 Document Reviewed: 10/16/2015 °Elsevier Interactive Patient Education © 2018 Elsevier Inc. °Moderate Conscious Sedation, Adult, Care After °These instructions provide you with information about caring for yourself after your procedure. Your health care provider may also give you more specific instructions. Your treatment has been planned according to current medical practices, but problems sometimes occur. Call your health care provider if you have any problems or questions after your procedure. °What can I expect after the procedure? °After your procedure, it is common: °· To feel sleepy for several hours. °· To feel clumsy and have poor balance for several hours. °· To have poor judgment for several hours. °· To vomit if you eat too soon. ° °Follow these instructions at home: °For at least 24 hours after the procedure: ° °· Do not: °? Participate in activities where you could fall or become injured. °? Drive. °? Use heavy machinery. °? Drink alcohol. °? Take sleeping pills or medicines that cause drowsiness. °? Make important decisions or sign legal documents. °? Take care of children on your own. °· Rest. °Eating and drinking °· Follow the diet recommended by your health care provider. °· If you vomit: °? Drink water, juice, or soup when you can drink without vomiting. °? Make sure you have little or no nausea before eating solid foods. °General instructions °· Have a responsible adult stay with you until you are awake and alert. °· Take over-the-counter and prescription medicines only as told by your health care provider. °· If you smoke, do not smoke without supervision. °· Keep all follow-up visits as  told by your health care provider. This is important. °Contact a health care provider if: °· You keep feeling nauseous or you keep vomiting. °· You feel light-headed. °· You develop a rash. °· You have a fever. °Get help right away if: °· You have trouble breathing. °This information is not intended to replace advice given to you by your health care provider. Make sure you discuss any questions you have with your health care provider. °Document Released: 02/22/2013 Document Revised: 10/07/2015 Document Reviewed: 08/24/2015 °Elsevier Interactive Patient Education © 2018 Elsevier Inc. ° °

## 2017-03-02 NOTE — Sedation Documentation (Signed)
Patient denies pain and is resting comfortably.  

## 2017-03-02 NOTE — Consult Note (Signed)
Chief Complaint: Patient was seen in consultation today for CT-guided bone marrow biopsy  Referring Physician(s): Ennever,Peter R  Supervising Physician: Jacqulynn Cadet  Patient Status: [facility] - Out-pt  History of Present Illness: Alexandria Oliver is a 61 y.o. female with history of early onset Alzheimer's disease, left hip pain, scattered lytic bony lesions on bone scan and family history of myeloma who presents today for CT-guided bone marrow biopsy for further evaluation.  Past Medical History:  Diagnosis Date  . Acute confusion   . Chicken pox   . Depression     Past Surgical History:  Procedure Laterality Date  . Unremarkable.      Allergies: Patient has no known allergies.  Medications: Prior to Admission medications   Medication Sig Start Date End Date Taking? Authorizing Provider  Cholecalciferol (VITAMIN D-1000 MAX ST) 1000 units tablet Take 1,000 Units by mouth daily.    Yes [provider]  donepezil (ARICEPT) 10 MG tablet Take 10 mg by mouth at bedtime.  07/17/14  Yes [provider]  escitalopram (LEXAPRO) 20 MG tablet Take 1 tablet (20 mg total) by mouth daily. 01/21/17  Yes Brunetta Jeans, PA-C  magnesium hydroxide (MILK OF MAGNESIA) 400 MG/5ML suspension Take 30 mLs by mouth daily as needed for mild constipation.   Yes [provider]  meloxicam (MOBIC) 7.5 MG tablet Take 1 tablet (7.5 mg total) by mouth daily. 01/21/17  Yes Brunetta Jeans, PA-C  memantine (NAMENDA XR) 28 MG CP24 24 hr capsule Take 1 capsule (28 mg total) by mouth daily. 01/19/17  Yes Brunetta Jeans, PA-C  Multiple Vitamin (MULTIVITAMIN) tablet Take 1 tablet by mouth daily.   Yes [provider]  multivitamin-lutein (OCUVITE-LUTEIN) CAPS capsule Take 1 capsule by mouth daily.   Yes [provider]  simvastatin (ZOCOR) 20 MG tablet Take 1 tablet (20 mg total) by mouth at bedtime. 01/21/17  Yes Brunetta Jeans, PA-C  UNABLE TO FIND daily. Med  Name: Resveratrol   Yes [provider]  Biotin 1 MG CAPS Take 1 capsule by mouth daily.     [provider]     Family History  Problem Relation Age of Onset  . Diabetes Father        Deceased  . Cancer Father 32       Multiple Myeloma  . Heart defect Mother        Deceased  . Asthma Sister   . Heart defect Sister   . Heart defect Brother   . Migraines Other        Familial  . Heart attack Sister   . Healthy Brother        x4  . Healthy Sister        x2  . Alzheimer's disease Maternal Grandfather        Dementia  . Liver disease Maternal Uncle   . Healthy Son        x1  . Colon cancer Neg Hx   . Esophageal cancer Neg Hx   . Rectal cancer Neg Hx   . Stomach cancer Neg Hx     Social History   Social History  . Marital status: Widowed    Spouse name: N/A  . Number of children: N/A  . Years of education: N/A   Social History Main Topics  . Smoking status: Never Smoker  . Smokeless tobacco: Never Used  . Alcohol use No     Comment: rare  . Drug use:  No  . Sexual activity: No   Other Topics Concern  . None   Social History Narrative  . None      Review of Systems denies fever, headache, chest pain, dyspnea, cough, abdominal pain, back pain, nausea, vomiting or bleeding. She has had some intermittent left hip pain  Vital Signs: BP 138/76 (BP Location: Right Arm)   Pulse 71   Temp (!) 97.5 F (36.4 C) (Oral)   Resp 16   Ht 5' 1"  (1.549 m)   Wt 124 lb (56.2 kg)   SpO2 100%   BMI 23.43 kg/m   Physical Exam :awake,noted early Alzheimer's; chest clear to auscultation bilaterally. Heart with regular rate and rhythm. Abdomen soft, positive bowel sounds, nontender. No lower extremity edema.  Imaging: No results found.  Labs:  CBC:  Recent Labs  04/28/16 1047 01/29/17 1349  WBC 4.9 4.6  HGB 13.5 12.4  HCT 40.3 37.6  PLT 266.0 208    COAGS: No results for input(s): INR, APTT in the last 8760 hours.  BMP:  Recent Labs   04/28/16 1047 01/29/17 1349  NA 140 143  K 3.6 3.8  CL 103 106  CO2 29 28  GLUCOSE 125* 117  BUN 13 19  CALCIUM 9.9 9.4  CREATININE 0.69 0.8    LIVER FUNCTION TESTS:  Recent Labs  04/28/16 1047 01/21/17 1018 01/29/17 1349  BILITOT 0.8 0.7 1.20  AST 34 25 35  ALT 51* 36* 65*  ALKPHOS 60 67 94*  PROT 7.9 7.2 7.4  7.5  ALBUMIN 4.7 4.5 3.7    TUMOR MARKERS: No results for input(s): AFPTM, CEA, CA199, CHROMGRNA in the last 8760 hours.  Assessment and Plan: 61 y.o. female with history of early onset Alzheimer's disease,left hip pain, scattered lytic bony lesions on bone scan and family history of myeloma who presents today for CT-guided bone marrow biopsy for further evaluation.Risks and benefits discussed with the patient/brother-POA including, but not limited to bleeding, infection, damage to adjacent structures or low yield requiring additional tests.All of the patient's questions were answered, patient's brother is agreeable to proceed.Consent signed and in chart.     Thank you for this interesting consult.  I greatly enjoyed meeting Alexandria Oliver and look forward to participating in their care.  A copy of this report was sent to the requesting provider on this date.  Electronically Signed: D. Rowe Robert, PA-C 03/02/2017, 9:15 AM   I spent a total of 20 minutes    in face to face in clinical consultation, greater than 50% of which was counseling/coordinating care for CT-guided bone marrow biopsy

## 2017-03-04 ENCOUNTER — Other Ambulatory Visit: Payer: Self-pay | Admitting: Hematology & Oncology

## 2017-03-04 DIAGNOSIS — D472 Monoclonal gammopathy: Secondary | ICD-10-CM

## 2017-03-04 MED ORDER — MELOXICAM 7.5 MG PO TABS: 7.5000 mg | ORAL_TABLET | Freq: Every day | ORAL | 6 refills | Status: DC

## 2017-03-04 MED FILL — MELOXICAM 7.5 MG TABLET: 7.5 | 30 days supply | Qty: 30 | Fill #0

## 2017-03-16 ENCOUNTER — Other Ambulatory Visit: Payer: Self-pay | Admitting: Family

## 2017-03-16 ENCOUNTER — Ambulatory Visit (HOSPITAL_COMMUNITY)
Admission: RE | Admit: 2017-03-16 | Discharge: 2017-03-16 | Disposition: A | Discharge status: Home / Self Care | Payer: Medicare Other | Source: Ambulatory Visit | Attending: Hematology & Oncology | Admitting: Hematology & Oncology

## 2017-03-16 DIAGNOSIS — M25552 Pain in left hip: Secondary | ICD-10-CM

## 2017-03-16 DIAGNOSIS — D259 Leiomyoma of uterus, unspecified: Secondary | ICD-10-CM | POA: Diagnosis not present

## 2017-03-16 DIAGNOSIS — D472 Monoclonal gammopathy: Secondary | ICD-10-CM | POA: Insufficient documentation

## 2017-03-16 MED ORDER — GADOBENATE DIMEGLUMINE 529 MG/ML IV SOLN
10.0000 mL | Freq: Once | INTRAVENOUS | Status: AC | PRN
  Administered 2017-03-16: 10 mL via INTRAVENOUS

## 2017-03-23 ENCOUNTER — Other Ambulatory Visit: Payer: Self-pay | Admitting: Hematology & Oncology

## 2017-03-23 ENCOUNTER — Encounter: Payer: Self-pay | Admitting: Hematology & Oncology

## 2017-03-23 ENCOUNTER — Telehealth: Payer: Self-pay | Admitting: Hematology & Oncology

## 2017-03-23 DIAGNOSIS — D472 Monoclonal gammopathy: Secondary | ICD-10-CM | POA: Insufficient documentation

## 2017-03-23 NOTE — Telephone Encounter (Signed)
lvm to inform pt brother of lab/ov appt 09/20/17 at 10 am per sch msg

## 2017-03-30 ENCOUNTER — Encounter (HOSPITAL_COMMUNITY): Payer: Self-pay

## 2017-03-30 MED FILL — MELOXICAM 7.5 MG TABLET: 7.5 | 30 days supply | Qty: 30 | Fill #1

## 2017-03-31 ENCOUNTER — Encounter: Payer: Self-pay | Admitting: Family Medicine

## 2017-03-31 ENCOUNTER — Ambulatory Visit (INDEPENDENT_AMBULATORY_CARE_PROVIDER_SITE_OTHER): Payer: Medicare Other | Admitting: Physician Assistant

## 2017-03-31 ENCOUNTER — Encounter: Payer: Self-pay | Admitting: Physician Assistant

## 2017-03-31 ENCOUNTER — Ambulatory Visit (INDEPENDENT_AMBULATORY_CARE_PROVIDER_SITE_OTHER): Payer: Medicare Other | Admitting: Family Medicine

## 2017-03-31 VITALS — BP 100/68 | HR 78 | Temp 97.9°F | Resp 14 | Ht 61.0 in | Wt 119.0 lb

## 2017-03-31 DIAGNOSIS — G8929 Other chronic pain: Secondary | ICD-10-CM | POA: Diagnosis not present

## 2017-03-31 DIAGNOSIS — M25552 Pain in left hip: Secondary | ICD-10-CM | POA: Diagnosis not present

## 2017-03-31 LAB — CHROMOSOME ANALYSIS, BONE MARROW

## 2017-03-31 LAB — TISSUE HYBRIDIZATION (BONE MARROW)-NCBH

## 2017-03-31 MED ORDER — METHYLPREDNISOLONE ACETATE 40 MG/ML IJ SUSP
40.0000 mg | Freq: Once | INTRAMUSCULAR | Status: AC
  Administered 2017-03-31: 40 mg via INTRA_ARTICULAR

## 2017-03-31 MED ORDER — HYDROCODONE-ACETAMINOPHEN 10-325 MG PO TABS: 0.5000 | ORAL_TABLET | Freq: Three times a day (TID) | ORAL | 0 refills | Status: DC | PRN

## 2017-03-31 MED FILL — HYDROCODON-APAP 10-325: 10-325 | 3 days supply | Qty: 10 | Fill #0

## 2017-03-31 NOTE — Progress Notes (Signed)
Pre visit review using our clinic review tool, if applicable. No additional management support is needed unless otherwise documented below in the visit note. 

## 2017-03-31 NOTE — Progress Notes (Signed)
Patient presents to clinic today with her caregiver. Patient is still suffering from L hip pain on a daily basis but acutely worsening over the past week. Endorses pain in the lateral hip and buttocks but now with some pain radiating into her leg. Denies change to bowel/bladder habits. Denies saddle anesthesia. Meloxicam 15 mg ineffective per caregiver. Is crying due to severity of pain. Denies back pain. Denies any trauma or injury. Has had x-ray negative for fracture but had concern for lytic lesions. She was assessed by Oncology who performed MRI that was negative.   Past Medical History:  Diagnosis Date  . Acute confusion   . Chicken pox   . Depression     Current Outpatient Medications on File Prior to Visit  Medication Sig Dispense Refill  . Biotin 1 MG CAPS Take 1 capsule by mouth daily.     . Cholecalciferol (VITAMIN D-1000 MAX ST) 1000 units tablet Take 1,000 Units by mouth daily.     Marland Kitchen donepezil (ARICEPT) 10 MG tablet Take 10 mg by mouth at bedtime.   3  . escitalopram (LEXAPRO) 20 MG tablet Take 1 tablet (20 mg total) by mouth daily. 90 tablet 1  . magnesium hydroxide (MILK OF MAGNESIA) 400 MG/5ML suspension Take 30 mLs by mouth daily as needed for mild constipation.    . memantine (NAMENDA XR) 28 MG CP24 24 hr capsule Take 1 capsule (28 mg total) by mouth daily. 90 capsule 1  . Multiple Vitamin (MULTIVITAMIN) tablet Take 1 tablet by mouth daily.    . multivitamin-lutein (OCUVITE-LUTEIN) CAPS capsule Take 1 capsule by mouth daily.    . simvastatin (ZOCOR) 20 MG tablet Take 1 tablet (20 mg total) by mouth at bedtime. 90 tablet 1  . UNABLE TO FIND daily. Med Name: Resveratrol     No current facility-administered medications on file prior to visit.     No Known Allergies  Family History  Problem Relation Age of Onset  . Diabetes Father        Deceased  . Cancer Father 79       Multiple Myeloma  . Heart defect Mother        Deceased  . Asthma Sister   . Heart defect  Sister   . Heart defect Brother   . Migraines Other        Familial  . Heart attack Sister   . Healthy Brother        x4  . Healthy Sister        x2  . Alzheimer's disease Maternal Grandfather        Dementia  . Liver disease Maternal Uncle   . Healthy Son        x1  . Colon cancer Neg Hx   . Esophageal cancer Neg Hx   . Rectal cancer Neg Hx   . Stomach cancer Neg Hx     Social History   Socioeconomic History  . Marital status: Widowed    Spouse name: None  . Number of children: None  . Years of education: None  . Highest education level: None  Social Needs  . Financial resource strain: None  . Food insecurity - worry: None  . Food insecurity - inability: None  . Transportation needs - medical: None  . Transportation needs - non-medical: None  Occupational History  . None  Tobacco Use  . Smoking status: Never Smoker  . Smokeless tobacco: Never Used  Substance and Sexual Activity  . Alcohol  use: No    Comment: rare  . Drug use: No  . Sexual activity: No  Other Topics Concern  . None  Social History Narrative  . None   Review of Systems - See HPI.  All other ROS are negative.  BP 100/68   Pulse 78   Temp 97.9 F (36.6 C) (Oral)   Resp 14   Ht 5' 1"  (1.549 m)   Wt 119 lb (54 kg)   SpO2 100%   BMI 22.48 kg/m   Physical Exam  Constitutional: She is well-developed, well-nourished, and in no distress.  HENT:  Head: Normocephalic and atraumatic.  Eyes: Conjunctivae are normal.  Neck: Neck supple.  Cardiovascular: Normal rate, regular rhythm, normal heart sounds and intact distal pulses.  Pulmonary/Chest: Effort normal and breath sounds normal. No respiratory distress. She has no wheezes. She has no rales. She exhibits no tenderness.  Musculoskeletal:       Lumbar back: Normal.  Examination very difficult due to patient's advanced dementia and inability to follow more complex commands. There is pain with flexion of the extremity and later abduction.  There is also significant tenderness of the lateral hip, in the region of trochanteric bursa. Patient starts crying with palpation in this area.   Neurological: She is alert.  Skin: Skin is warm and dry. No rash noted.  Vitals reviewed.   Recent Results (from the past 2160 hour(s))  Hepatic function panel     Status: Abnormal   Collection Time: 01/21/17 10:18 AM  Result Value Ref Range   Total Bilirubin 0.7 0.2 - 1.2 mg/dL   Bilirubin, Direct 0.1 0.0 - 0.3 mg/dL   Alkaline Phosphatase 67 39 - 117 U/L   AST 25 0 - 37 U/L   ALT 36 (H) 0 - 35 U/L   Total Protein 7.2 6.0 - 8.3 g/dL   Albumin 4.5 3.5 - 5.2 g/dL  CBC with Differential [facility])     Status: None   Collection Time: 01/29/17  1:49 PM  Result Value Ref Range   WBC 4.6 3.9 - 10.0 10e3/uL   RBC 3.97 3.70 - 5.32 10e6/uL   HGB 12.4 11.6 - 15.9 g/dL   HCT 37.6 34.8 - 46.6 %   MCV 95 81 - 101 fL   MCH 31.2 26.0 - 34.0 pg   MCHC 33.0 32.0 - 36.0 g/dL   RDW 11.2 11.1 - 15.7 %   Platelets 208 145 - 400 10e3/uL   NEUT# 2.1 1.5 - 6.5 10e3/uL   LYMPH# 2.1 0.9 - 3.3 10e3/uL   MONO# 0.3 0.1 - 0.9 10e3/uL   Eosinophils Absolute 0.1 0.0 - 0.5 10e3/uL   BASO# 0.0 0.0 - 0.2 10e3/uL   NEUT% 45.3 39.6 - 80.0 %   LYMPH% 45.2 14.0 - 48.0 %   MONO% 6.4 0.0 - 13.0 %   EOS% 2.9 0.0 - 7.0 %   BASO% 0.2 0.0 - 2.0 %  CMP STAT ([facility] only)     Status: Abnormal   Collection Time: 01/29/17  1:49 PM  Result Value Ref Range   Sodium 143 128 - 145 mEq/L   Potassium 3.8 3.3 - 4.7 mEq/L   Chloride 106 98 - 108 mEq/L   CO2 28 18 - 33 mEq/L   Glucose, Bld 117 73 - 118 mg/dL   BUN, Bld 19 7 - 22 mg/dL   Creat 0.8 0.6 - 1.2 mg/dl   Total Bilirubin 1.20 0.20 - 1.60 mg/dl  Alkaline Phosphatase 94 (H) 26 - 84 U/L   AST 35 11 - 38 U/L   ALT(SGPT) 65 (H) 10 - 47 U/L   Total Protein 7.5 6.4 - 8.1 g/dL   Albumin 3.7 3.3 - 5.5 g/dL   Calcium 9.4 8.0 - 10.3 mg/dL  IgG, IgA, IgM     Status: Abnormal   Collection Time:  01/29/17  1:49 PM  Result Value Ref Range   IgG, Qn, Serum 1,300 700 - 1600 mg/dL   IgA, Qn, Serum 385 (H) 87 - 352 mg/dL   IgM, Qn, Serum 59 26 - 217 mg/dL  Beta 2 microglobulin, serum     Status: None   Collection Time: 01/29/17  1:49 PM  Result Value Ref Range   Beta-2 2.0 0.6 - 2.4 mg/L    Comment: Siemens Immulite 2000 Immunochemiluminometric assay (ICMA)  Kappa/lambda light chains     Status: None   Collection Time: 01/29/17  1:49 PM  Result Value Ref Range   Ig Kappa Free Light Chain 18.3 3.3 - 19.4 mg/L   Ig Lambda Free Light Chain 20.7 5.7 - 26.3 mg/L   Kappa/Lambda FluidC Ratio 0.88 0.26 - 1.65  Lactate dehydrogenase     Status: None   Collection Time: 01/29/17  1:49 PM  Result Value Ref Range   LDH 196 125 - 245 U/L  Serum protein electrophoresis with reflex     Status: None   Collection Time: 01/29/17  1:49 PM  Result Value Ref Range   Total Protein 7.4 6.0 - 8.5 g/dL   Albumin 3.9 2.9 - 4.4 g/dL   Alpha 1 0.2 0.0 - 0.4 g/dL   Alpha 2 0.7 0.4 - 1.0 g/dL   Beta 1.3 0.7 - 1.3 g/dL   Gamma Globulin 1.3 0.4 - 1.8 g/dL   M-Spike, % Not Observed Not Observed g/dL   GLOBULIN, TOTAL 3.5 2.2 - 3.9 g/dL   A/G Ratio 1.1 0.7 - 1.7   Please Note: Comment     Comment: Protein electrophoresis scan will follow via computer, mail, or courier delivery.    Interpretation(See Below) Comment     Comment: The SPE pattern appears essentially unremarkable. Evidence of monoclonal protein is not apparent.   [facility] - Smear     Status: None   Collection Time: 01/29/17  1:49 PM  Result Value Ref Range   Smear Result Smear Available   Basic metabolic panel     Status: Abnormal   Collection Time: 03/02/17  9:11 AM  Result Value Ref Range   Sodium 139 135 - 145 mmol/L   Potassium 4.0 3.5 - 5.1 mmol/L   Chloride 103 101 - 111 mmol/L   CO2 28 22 - 32 mmol/L   Glucose, Bld 118 (H) 65 - 99 mg/dL   BUN 11 6 - 20 mg/dL   Creatinine, Ser 0.71 0.44 - 1.00 mg/dL   Calcium 9.5 8.9 -  10.3 mg/dL   GFR calc non Af Amer >60 >60 mL/min   GFR calc Af Amer >60 >60 mL/min    Comment: (NOTE) The eGFR has been calculated using the CKD EPI equation. This calculation has not been validated in all clinical situations. eGFR's persistently <60 mL/min signify possible Chronic Kidney Disease.    Anion gap 8 5 - 15  CBC with Differential/Platelet     Status: None   Collection Time: 03/02/17  9:11 AM  Result Value Ref Range   WBC 5.3 4.0 - 10.5 K/uL   RBC  4.41 3.87 - 5.11 MIL/uL   Hemoglobin 13.6 12.0 - 15.0 g/dL   HCT 40.9 36.0 - 46.0 %   MCV 92.7 78.0 - 100.0 fL   MCH 30.8 26.0 - 34.0 pg   MCHC 33.3 30.0 - 36.0 g/dL   RDW 12.2 11.5 - 15.5 %   Platelets 207 150 - 400 K/uL   Neutrophils Relative % 55 %   Neutro Abs 2.9 1.7 - 7.7 K/uL   Lymphocytes Relative 36 %   Lymphs Abs 1.9 0.7 - 4.0 K/uL   Monocytes Relative 7 %   Monocytes Absolute 0.4 0.1 - 1.0 K/uL   Eosinophils Relative 2 %   Eosinophils Absolute 0.1 0.0 - 0.7 K/uL   Basophils Relative 0 %   Basophils Absolute 0.0 0.0 - 0.1 K/uL  Protime-INR     Status: None   Collection Time: 03/02/17  9:11 AM  Result Value Ref Range   Prothrombin Time 12.7 11.4 - 15.2 seconds   INR 0.96   Chromosome Analysis, Bone Marrow     Status: None   Collection Time: 03/02/17 11:10 AM  Result Value Ref Range   Chromosome-Routine SEE SEPARATE REPORT     Comment: Performed at [facility]  Tissue hybridization (bone marrow)-[facility]     Status: None   Collection Time: 03/02/17 11:10 AM  Result Value Ref Range   Tissue hybridization (bone marrow)-[facility] SEE SEPARATE REPORT     Comment: Performed at [facility]    Assessment/Plan: 1. Chronic left hip pain Likely multifactorial. Giving difficulty with examination it is hard to fully assess. Do suspect there is new onset of bursitis along with sciatica. Will start Hydrocodone. Will hold off on steroid or gabapentin addition until after her assessment today with Sports  Medicine. - Ambulatory referral to [place], Lujain Kraszewski Cody, PA-C

## 2017-03-31 NOTE — Patient Instructions (Signed)
You have IT band syndrome and trochanteric bursitis Avoid painful activities as much as possible. Ice over area of pain 3-4 times a day for 15 minutes at a time Standing hip rotations, hip side raise exercise 3 sets of 10 once a day - add weights if this becomes too easy. Stretches - pick 2-3 and hold for 20-30 seconds x 3 - do once or twice a day. Mobic daily with food as needed for pain and inflammation. Ok to take the vicodin if needed with this. You were given a cortisone injection today. Let me know how you're doing in a week.

## 2017-03-31 NOTE — Patient Instructions (Addendum)
Please use the hydrocodone to help with pain as anti-inflammatories and OTC analgesics have not been effective.  Please see Dr. Barbaraann Barthel as scheduled today so we can get his input giving significant pain and chronicity of symptoms. I feel there is some sciatica present but concerned there has been development in bursitis giving severe pain with palpation and manipulation in this region.

## 2017-04-01 ENCOUNTER — Ambulatory Visit: Payer: Medicare Other | Admitting: Neurology

## 2017-04-04 ENCOUNTER — Encounter: Payer: Self-pay | Admitting: Family Medicine

## 2017-04-04 DIAGNOSIS — M545 Low back pain, unspecified: Secondary | ICD-10-CM | POA: Insufficient documentation

## 2017-04-04 DIAGNOSIS — M79605 Pain in left leg: Secondary | ICD-10-CM

## 2017-04-04 NOTE — Progress Notes (Addendum)
PCP and consultation requested by: Brunetta Jeans, PA-C  Subjective:   HPI: Patient is a 61 y.o. female here for left hip pain.  Patient is here with her brother who provided most of the history.   They note she's complained of about 4 months of left hip pain. Worse with lying on the side, standing, transferring positions. Has tried mobic and home exercises with minimal benefit. Pain is currently 0/10 but can be sharp and severe laterally. They report she may have stepped on a pebble and slipped but no injury date and this is not certain. She had negative MRI of this hip. No skin changes, numbness.  Past Medical History:  Diagnosis Date  . Acute confusion   . Chicken pox   . Depression     Current Outpatient Medications on File Prior to Visit  Medication Sig Dispense Refill  . Biotin 1 MG CAPS Take 1 capsule by mouth daily.     . Cholecalciferol (VITAMIN D-1000 MAX ST) 1000 units tablet Take 1,000 Units by mouth daily.     Marland Kitchen donepezil (ARICEPT) 10 MG tablet Take 10 mg by mouth at bedtime.   3  . escitalopram (LEXAPRO) 20 MG tablet Take 1 tablet (20 mg total) by mouth daily. 90 tablet 1  . HYDROcodone-acetaminophen (NORCO) 10-325 MG tablet Take 0.5-1 tablets every 8 (eight) hours as needed by mouth. 10 tablet 0  . magnesium hydroxide (MILK OF MAGNESIA) 400 MG/5ML suspension Take 30 mLs by mouth daily as needed for mild constipation.    . meloxicam (MOBIC) 7.5 MG tablet   6  . memantine (NAMENDA XR) 28 MG CP24 24 hr capsule Take 1 capsule (28 mg total) by mouth daily. 90 capsule 1  . Multiple Vitamin (MULTIVITAMIN) tablet Take 1 tablet by mouth daily.    . multivitamin-lutein (OCUVITE-LUTEIN) CAPS capsule Take 1 capsule by mouth daily.    . simvastatin (ZOCOR) 20 MG tablet Take 1 tablet (20 mg total) by mouth at bedtime. 90 tablet 1  . UNABLE TO FIND daily. Med Name: Resveratrol     No current facility-administered medications on file prior to visit.     Past Surgical  History:  Procedure Laterality Date  . Unremarkable.      No Known Allergies  Social History   Socioeconomic History  . Marital status: Widowed    Spouse name: Not on file  . Number of children: Not on file  . Years of education: Not on file  . Highest education level: Not on file  Social Needs  . Financial resource strain: Not on file  . Food insecurity - worry: Not on file  . Food insecurity - inability: Not on file  . Transportation needs - medical: Not on file  . Transportation needs - non-medical: Not on file  Occupational History  . Not on file  Tobacco Use  . Smoking status: Never Smoker  . Smokeless tobacco: Never Used  Substance and Sexual Activity  . Alcohol use: No    Comment: rare  . Drug use: No  . Sexual activity: No  Other Topics Concern  . Not on file  Social History Narrative  . Not on file    Family History  Problem Relation Age of Onset  . Diabetes Father        Deceased  . Cancer Father 27       Multiple Myeloma  . Heart defect Mother        Deceased  . Asthma Sister   .  Heart defect Sister   . Heart defect Brother   . Migraines Other        Familial  . Heart attack Sister   . Healthy Brother        x4  . Healthy Sister        x2  . Alzheimer's disease Maternal Grandfather        Dementia  . Liver disease Maternal Uncle   . Healthy Son        x1  . Colon cancer Neg Hx   . Esophageal cancer Neg Hx   . Rectal cancer Neg Hx   . Stomach cancer Neg Hx     BP 138/83   Pulse 80   Ht 5' (1.524 m)   Wt 119 lb (54 kg)   BMI 23.24 kg/m   Review of Systems: See HPI above.     Objective:  Physical Exam:  Gen: NAD, comfortable in exam room.  Difficulty following commands.  Back: No gross deformity, scoliosis. No TTP .  No midline or bony TTP. FROM. Strength LEs 5/5 all muscle groups except hip noted below.   Negative SLRs. Sensation difficult to assess.  Left hip: No gross deformity. TTP proximal IT band and trochanteric  bursa. FROM with 4/5 hip abduction strength. Negative logroll. Negative fabers and piriformis stretches.   Assessment & Plan:  1. Left hip pain - consistent with IT band syndrome and trochanteric bursitis.  Injection given today.  Icing.  Shown home stretches and exercises she can do daily as well.  Continue mobic with vicodin as needed for severe pain.  Let us know how she's doing in a week.  After informed written consent patient was lying on right side on exam table.  Area overlying left trochanteric bursa prepped with alcohol swab then injected with 6:2 bupivicaine: depomedrol.  Patient tolerated procedure well without immediate complications.

## 2017-04-04 NOTE — Assessment & Plan Note (Signed)
consistent with IT band syndrome and trochanteric bursitis.  Injection given today.  Icing.  Shown home stretches and exercises she can do daily as well.  Continue mobic with vicodin as needed for severe pain.  Let us know how she's doing in a week.  After informed written consent patient was lying on right side on exam table.  Area overlying left trochanteric bursa prepped with alcohol swab then injected with 6:2 bupivicaine: depomedrol.  Patient tolerated procedure well without immediate complications.

## 2017-04-07 ENCOUNTER — Telehealth: Payer: Self-pay | Admitting: Family Medicine

## 2017-04-07 MED ORDER — GABAPENTIN 300 MG PO CAPS: 300.0000 mg | ORAL_CAPSULE | Freq: Every day | ORAL | 2 refills | Status: DC

## 2017-04-07 NOTE — Telephone Encounter (Signed)
Spoke to patient's brother regarding MRI of Lumbar Spine and he would like to hold off on getting that done for now. States she just had an MRI of her hip.   He states Gabapentin Rx was discussed if injection did not help and he would like to go ahead with this medication. Patient uses [facility]

## 2017-04-07 NOTE — Telephone Encounter (Signed)
I sent gabapentin in downstairs to start at bedtime first.  If she does well with this we could titrate up eventually to three times a day.

## 2017-04-07 NOTE — Telephone Encounter (Signed)
Her MRI of the hip was reassuring and if injection for bursitis didn't help the only other thing I could think of is if this is being referred from the lumbar spine.  Would they like to go ahead with MRI of this area as well to look for a pinched nerve?  That would be my recommendation.

## 2017-04-07 NOTE — Telephone Encounter (Signed)
Patient's brother called to follow up after cortisone injection in left hip   States for 3 days she felt relief but on the 4th and 5th day the pain came back and has been unbearable. States he gave her a Vicodin a couple hours ago to help with pain

## 2017-04-12 NOTE — Telephone Encounter (Signed)
Have been unable to reach patient's brother

## 2017-04-21 NOTE — Progress Notes (Signed)
Subjective:   Alexandria Oliver is a 61 y.o. female who presents for an Initial Medicare Annual Wellness Visit.  Accompanied by brother, Alexandria Oliver. Brother is patients care giver and answered all questions for AWV.   Review of Systems    No ROS.  Medicare Wellness Visit. Additional risk factors are reflected in the social history.   Cardiac Risk Factors include: family history of premature cardiovascular disease;sedentary lifestyle   Sleep patterns: Sleeps 10-14 hours.  Home Safety/Smoke Alarms: Feels safe in home. Smoke alarms in place.  Living environment; residence and Firearm Safety: Lives with family in split level home.  Seat Belt Safety/Bike Helmet: Wears seat belt.   Female:   Pap-09/26/2014      Mammo-06/11/2016, negative. Scheduled 06/15/2017.        Dexa scan-N/A        [facility]S-Colonoscopy 08/03/2016, normal. Recall 10 years.      Objective:    Today's Vitals   04/22/17 1017  BP: 118/76  Pulse: 70  SpO2: 97%  Weight: 117 lb (53.1 kg)  Height: 5' (1.524 m)  PainSc: 5    Body mass index is 22.85 kg/m.  Advanced Directives 04/22/2017 03/02/2017 01/29/2017 08/03/2016 11/13/2013  Does Patient Have a Medical Advance Directive? No Yes No - Patient would not like information  Type of Advance Directive - Healthcare Power of [place] -  Does patient want to make changes to medical advance directive? - No - Patient declined - - -  Copy of [place] in Chart? - Yes - - -  Would patient like information on creating a medical advance directive? Yes (MAU/Ambulatory/Procedural Areas - Information given) - - - -    Current Medications (verified) Outpatient Encounter Medications as of 04/22/2017  Medication Sig  . Cholecalciferol (VITAMIN D-1000 MAX ST) 1000 units tablet Take 1,000 Units by mouth daily.   Marland Kitchen donepezil (ARICEPT) 10 MG tablet Take 10 mg by mouth at bedtime.   Marland Kitchen escitalopram (LEXAPRO) 20 MG tablet Take 1 tablet (20 mg  total) by mouth daily.  Marland Kitchen HYDROcodone-acetaminophen (NORCO) 10-325 MG tablet Take 0.5-1 tablets every 8 (eight) hours as needed by mouth.  . magnesium hydroxide (MILK OF MAGNESIA) 400 MG/5ML suspension Take 30 mLs by mouth daily as needed for mild constipation.  . meloxicam (MOBIC) 7.5 MG tablet   . memantine (NAMENDA XR) 28 MG CP24 24 hr capsule Take 1 capsule (28 mg total) by mouth daily.  . Multiple Vitamin (MULTIVITAMIN) tablet Take 1 tablet by mouth daily.  . multivitamin-lutein (OCUVITE-LUTEIN) CAPS capsule Take 1 capsule by mouth daily.  . simvastatin (ZOCOR) 20 MG tablet Take 1 tablet (20 mg total) by mouth at bedtime.  Marland Kitchen UNABLE TO FIND daily. Med Name: Resveratrol  . gabapentin (NEURONTIN) 300 MG capsule Take 1 capsule (300 mg total) by mouth at bedtime. (Patient not taking: Reported on 04/22/2017)  . [DISCONTINUED] Biotin 1 MG CAPS Take 1 capsule by mouth daily.    No facility-administered encounter medications on file as of 04/22/2017.     Allergies (verified) Patient has no known allergies.   History: Past Medical History:  Diagnosis Date  . Acute confusion   . Chicken pox   . Depression    Past Surgical History:  Procedure Laterality Date  . Unremarkable.     Family History  Problem Relation Age of Onset  . Diabetes Father        Deceased  . Cancer Father 90  Multiple Myeloma  . Heart defect Mother        Deceased  . Asthma Sister   . Heart defect Sister   . Heart defect Brother   . Migraines Other        Familial  . Heart attack Sister   . Healthy Brother        x4  . Healthy Sister        x2  . Alzheimer's disease Maternal Grandfather        Dementia  . Liver disease Maternal Uncle   . Healthy Son        x1  . Colon cancer Neg Hx   . Esophageal cancer Neg Hx   . Rectal cancer Neg Hx   . Stomach cancer Neg Hx    Social History   Socioeconomic History  . Marital status: Widowed    Spouse name: None  . Number of children: None  . Years of  education: None  . Highest education level: None  Social Needs  . Financial resource strain: None  . Food insecurity - worry: None  . Food insecurity - inability: None  . Transportation needs - medical: None  . Transportation needs - non-medical: None  Occupational History  . None  Tobacco Use  . Smoking status: Never Smoker  . Smokeless tobacco: Never Used  Substance and Sexual Activity  . Alcohol use: No    Comment: rare  . Drug use: No  . Sexual activity: No  Other Topics Concern  . None  Social History Narrative  . None    Tobacco Counseling Counseling given: Not Answered   Activities of Daily Living In your present state of health, do you have any difficulty performing the following activities: 04/22/2017 03/02/2017  Hearing? N N  Vision? N N  Difficulty concentrating or making decisions? Tempie Donning  Walking or climbing stairs? N N  Dressing or bathing? Y Y  Doing errands, shopping? Y -  Conservation officer, nature and eating ? Y -  Using the Toilet? N -  In the past six months, have you accidently leaked urine? N -  Do you have problems with loss of bowel control? N -  Managing your Medications? Y -  Managing your Finances? Y -  Housekeeping or managing your Housekeeping? Y -  Some recent data might be hidden   Patient with Early Onset Alzheimer's. Family members assist pt with ADLs.    Immunizations and Health Maintenance Immunization History  Administered Date(s) Administered  . Influenza, High Dose Seasonal PF 04/28/2016  . Influenza-Unspecified 02/15/2014  . Tdap 04/28/2016   Health Maintenance Due  Topic Date Due  . INFLUENZA VACCINE  12/16/2016    Patient Care Team: Delorse Limber as PCP - General (Family Medicine)  Indicate any recent Medical Services you may have received from other than [facility] providers in the past year (date may be approximate).     Assessment:   This is a routine wellness examination for Alexandria Oliver. Physical assessment deferred to  PCP.   Hearing/Vision screen Hearing Screening Comments: Able to hear conversational tones w/o difficulty. No issues reported.   Vision Screening Comments: Last exam 05/19/2015, only with changes. Wears glasses. [employer] and [employer].   Dietary issues and exercise activities discussed: Current Exercise Habits: The patient does not participate in regular exercise at present, Exercise limited by: neurologic condition(s)   Diet (meal preparation, eat out, water intake, caffeinated beverages, dairy products, fruits and vegetables): Drink coffee, water and juice.  Eats plenty of fruits and vegetables.   Goals    . Increase physical activity     Increase activity.        Depression Screen PHQ 2/9 Scores 12/29/2016 09/26/2014  PHQ - 2 Score - 0  Exception Documentation Medical reason -  Not completed Alzheimers/ealy dementia -    Fall Risk Fall Risk  04/22/2017 01/26/2017  Falls in the past year? No Yes  Number falls in past yr: - 1     Cognitive Function: MMSE - Mini Mental State Exam 01/26/2017  Orientation to time 0  Orientation to Place 2  Registration 1  Attention/ Calculation 0  Recall 0  Language- name 2 objects 1  Language- repeat 0  Language- follow 3 step command 1  Language- read & follow direction 0  Write a sentence 0  Copy design 0  Total score 5        Screening Tests Health Maintenance  Topic Date Due  . INFLUENZA VACCINE  12/16/2016  . PAP SMEAR  09/25/2017  . MAMMOGRAM  06/11/2018  . DTaP/Tdap/Td (2 - Td) 04/28/2026  . TETANUS/TDAP  04/28/2026  . COLONOSCOPY  08/04/2026  . Hepatitis C Screening  Completed  . HIV Screening  Completed      Plan:    Bring a copy of your living will and/or healthcare power of attorney to your next office visit.  I have personally reviewed and noted the following in the patient's chart:   . Medical and social history . Use of alcohol, tobacco or illicit drugs  . Current medications and  supplements . Functional ability and status . Nutritional status . Physical activity . Advanced directives . List of other physicians . Hospitalizations, surgeries, and ER visits in previous 12 months . Vitals . Screenings to include cognitive, depression, and falls . Referrals and appointments  In addition, I have reviewed and discussed with patient certain preventive protocols, quality metrics, and best practice recommendations. A written personalized care plan for preventive services as well as general preventive health recommendations were provided to patient.     Gerilyn Nestle, RN   04/22/2017

## 2017-04-22 ENCOUNTER — Ambulatory Visit (INDEPENDENT_AMBULATORY_CARE_PROVIDER_SITE_OTHER): Payer: Medicare Other

## 2017-04-22 ENCOUNTER — Other Ambulatory Visit: Payer: Self-pay

## 2017-04-22 DIAGNOSIS — Z0000 Encounter for general adult medical examination without abnormal findings: Secondary | ICD-10-CM

## 2017-04-22 DIAGNOSIS — Z23 Encounter for immunization: Secondary | ICD-10-CM | POA: Diagnosis not present

## 2017-04-22 NOTE — Progress Notes (Signed)
RN MWV note reviewed.  Jamilyn Pigeon Cody Marlie Kuennen, PA-C  

## 2017-04-22 NOTE — Patient Instructions (Addendum)
Bring a copy of your living will and/or healthcare power of attorney to your next office visit.  Fall Prevention in the Home Falls can cause injuries. They can happen to people of all ages. There are many things you can do to make your home safe and to help prevent falls. What can I do on the outside of my home?  Regularly fix the edges of walkways and driveways and fix any cracks.  Remove anything that might make you trip as you walk through a door, such as a raised step or threshold.  Trim any bushes or trees on the path to your home.  Use bright outdoor lighting.  Clear any walking paths of anything that might make someone trip, such as rocks or tools.  Regularly check to see if handrails are loose or broken. Make sure that both sides of any steps have handrails.  Any raised decks and porches should have guardrails on the edges.  Have any leaves, snow, or ice cleared regularly.  Use sand or salt on walking paths during winter.  Clean up any spills in your garage right away. This includes oil or grease spills. What can I do in the bathroom?  Use night lights.  Install grab bars by the toilet and in the tub and shower. Do not use towel bars as grab bars.  Use non-skid mats or decals in the tub or shower.  If you need to sit down in the shower, use a plastic, non-slip stool.  Keep the floor dry. Clean up any water that spills on the floor as soon as it happens.  Remove soap buildup in the tub or shower regularly.  Attach bath mats securely with double-sided non-slip rug tape.  Do not have throw rugs and other things on the floor that can make you trip. What can I do in the bedroom?  Use night lights.  Make sure that you have a light by your bed that is easy to reach.  Do not use any sheets or blankets that are too big for your bed. They should not hang down onto the floor.  Have a firm chair that has side arms. You can use this for support while you get  dressed.  Do not have throw rugs and other things on the floor that can make you trip. What can I do in the kitchen?  Clean up any spills right away.  Avoid walking on wet floors.  Keep items that you use a lot in easy-to-reach places.  If you need to reach something above you, use a strong step stool that has a grab bar.  Keep electrical cords out of the way.  Do not use floor polish or wax that makes floors slippery. If you must use wax, use non-skid floor wax.  Do not have throw rugs and other things on the floor that can make you trip. What can I do with my stairs?  Do not leave any items on the stairs.  Make sure that there are handrails on both sides of the stairs and use them. Fix handrails that are broken or loose. Make sure that handrails are as long as the stairways.  Check any carpeting to make sure that it is firmly attached to the stairs. Fix any carpet that is loose or worn.  Avoid having throw rugs at the top or bottom of the stairs. If you do have throw rugs, attach them to the floor with carpet tape.  Make sure that you have  a light switch at the top of the stairs and the bottom of the stairs. If you do not have them, ask someone to add them for you. What else can I do to help prevent falls?  Wear shoes that: ? Do not have high heels. ? Have rubber bottoms. ? Are comfortable and fit you well. ? Are closed at the toe. Do not wear sandals.  If you use a stepladder: ? Make sure that it is fully opened. Do not climb a closed stepladder. ? Make sure that both sides of the stepladder are locked into place. ? Ask someone to hold it for you, if possible.  Clearly mark and make sure that you can see: ? Any grab bars or handrails. ? First and last steps. ? Where the edge of each step is.  Use tools that help you move around (mobility aids) if they are needed. These include: ? Canes. ? Walkers. ? Scooters. ? Crutches.  Turn on the lights when you go into a  dark area. Replace any light bulbs as soon as they burn out.  Set up your furniture so you have a clear path. Avoid moving your furniture around.  If any of your floors are uneven, fix them.  If there are any pets around you, be aware of where they are.  Review your medicines with your doctor. Some medicines can make you feel dizzy. This can increase your chance of falling. Ask your doctor what other things that you can do to help prevent falls. This information is not intended to replace advice given to you by your health care provider. Make sure you discuss any questions you have with your health care provider. Document Released: 02/28/2009 Document Revised: 10/10/2015 Document Reviewed: 06/08/2014 Elsevier Interactive Patient Education  2018 [place] [employer], Female Adopting a healthy lifestyle and getting preventive care can go a long way to promote health and wellness. Talk with your health care provider about what schedule of regular examinations is right for you. This is a good chance for you to check in with your provider about disease prevention and staying healthy. In between checkups, there are plenty of things you can do on your own. Experts have done a lot of research about which lifestyle changes and preventive measures are most likely to keep you healthy. Ask your health care provider for more information. Weight and diet Eat a healthy diet  Be sure to include plenty of vegetables, fruits, low-fat dairy products, and lean protein.  Do not eat a lot of foods high in solid fats, added sugars, or salt.  Get regular exercise. This is one of the most important things you can do for your health. ? Most adults should exercise for at least 150 minutes each week. The exercise should increase your heart rate and make you sweat (moderate-intensity exercise). ? Most adults should also do strengthening exercises at least twice a week. This is in addition to the  moderate-intensity exercise.  Maintain a healthy weight  Body mass index (BMI) is a measurement that can be used to identify possible weight problems. It estimates body fat based on height and weight. Your health care provider can help determine your BMI and help you achieve or maintain a healthy weight.  For females 61 years of age and older: ? A BMI below 18.5 is considered underweight. ? A BMI of 18.5 to 24.9 is normal. ? A BMI of 25 to 29.9 is considered overweight. ? A BMI of  30 and above is considered obese.  Watch levels of cholesterol and blood lipids  You should start having your blood tested for lipids and cholesterol at 61 years of age, then have this test every 5 years.  You may need to have your cholesterol levels checked more often if: ? Your lipid or cholesterol levels are high. ? You are older than 61 years of age. ? You are at high risk for heart disease.  Cancer screening Lung Cancer  Lung cancer screening is recommended for adults 63-28 years old who are at high risk for lung cancer because of a history of smoking.  A yearly low-dose CT scan of the lungs is recommended for people who: ? Currently smoke. ? Have quit within the past 15 years. ? Have at least a 30-pack-year history of smoking. A pack year is smoking an average of one pack of cigarettes a day for 1 year.  Yearly screening should continue until it has been 15 years since you quit.  Yearly screening should stop if you develop a health problem that would prevent you from having lung cancer treatment.  Breast Cancer  Practice breast self-awareness. This means understanding how your breasts normally appear and feel.  It also means doing regular breast self-exams. Let your health care provider know about any changes, no matter how small.  If you are in your 20s or 30s, you should have a clinical breast exam (CBE) by a health care provider every 1-3 years as part of a regular health exam.  If you  are 73 or older, have a CBE every year. Also consider having a breast X-ray (mammogram) every year.  If you have a family history of breast cancer, talk to your health care provider about genetic screening.  If you are at high risk for breast cancer, talk to your health care provider about having an MRI and a mammogram every year.  Breast cancer gene (BRCA) assessment is recommended for women who have family members with BRCA-related cancers. BRCA-related cancers include: ? Breast. ? Ovarian. ? Tubal. ? Peritoneal cancers.  Results of the assessment will determine the need for genetic counseling and BRCA1 and BRCA2 testing.  Cervical Cancer Your health care provider may recommend that you be screened regularly for cancer of the pelvic organs (ovaries, uterus, and vagina). This screening involves a pelvic examination, including checking for microscopic changes to the surface of your cervix (Pap test). You may be encouraged to have this screening done every 3 years, beginning at age 81.  For women ages 11-65, health care providers may recommend pelvic exams and Pap testing every 3 years, or they may recommend the Pap and pelvic exam, combined with testing for human papilloma virus (HPV), every 5 years. Some types of HPV increase your risk of cervical cancer. Testing for HPV may also be done on women of any age with unclear Pap test results.  Other health care providers may not recommend any screening for nonpregnant women who are considered low risk for pelvic cancer and who do not have symptoms. Ask your health care provider if a screening pelvic exam is right for you.  If you have had past treatment for cervical cancer or a condition that could lead to cancer, you need Pap tests and screening for cancer for at least 20 years after your treatment. If Pap tests have been discontinued, your risk factors (such as having a new sexual partner) need to be reassessed to determine if screening should  resume.  Some women have medical problems that increase the chance of getting cervical cancer. In these cases, your health care provider may recommend more frequent screening and Pap tests.  Colorectal Cancer  This type of cancer can be detected and often prevented.  Routine colorectal cancer screening usually begins at 61 years of age and continues through 61 years of age.  Your health care provider may recommend screening at an earlier age if you have risk factors for colon cancer.  Your health care provider may also recommend using home test kits to check for hidden blood in the stool.  A small camera at the end of a tube can be used to examine your colon directly (sigmoidoscopy or colonoscopy). This is done to check for the earliest forms of colorectal cancer.  Routine screening usually begins at age 44.  Direct examination of the colon should be repeated every 5-10 years through 61 years of age. However, you may need to be screened more often if early forms of precancerous polyps or small growths are found.  Skin Cancer  Check your skin from head to toe regularly.  Tell your health care provider about any new moles or changes in moles, especially if there is a change in a mole's shape or color.  Also tell your health care provider if you have a mole that is larger than the size of a pencil eraser.  Always use sunscreen. Apply sunscreen liberally and repeatedly throughout the day.  Protect yourself by wearing long sleeves, pants, a wide-brimmed hat, and sunglasses whenever you are outside.  Heart disease, diabetes, and high blood pressure  High blood pressure causes heart disease and increases the risk of stroke. High blood pressure is more likely to develop in: ? People who have blood pressure in the high end of the normal range (130-139/85-89 mm Hg). ? People who are overweight or obese. ? People who are African American.  If you are 78-65 years of age, have your blood  pressure checked every 3-5 years. If you are 50 years of age or older, have your blood pressure checked every year. You should have your blood pressure measured twice-once when you are at a hospital or clinic, and once when you are not at a hospital or clinic. Record the average of the two measurements. To check your blood pressure when you are not at a hospital or clinic, you can use: ? An automated blood pressure machine at a pharmacy. ? A home blood pressure monitor.  If you are between 16 years and 20 years old, ask your health care provider if you should take aspirin to prevent strokes.  Have regular diabetes screenings. This involves taking a blood sample to check your fasting blood sugar level. ? If you are at a normal weight and have a low risk for diabetes, have this test once every three years after 61 years of age. ? If you are overweight and have a high risk for diabetes, consider being tested at a younger age or more often. Preventing infection Hepatitis B  If you have a higher risk for hepatitis B, you should be screened for this virus. You are considered at high risk for hepatitis B if: ? You were born in a country where hepatitis B is common. Ask your health care provider which countries are considered high risk. ? Your parents were born in a high-risk country, and you have not been immunized against hepatitis B (hepatitis B vaccine). ? You have HIV or AIDS. ?  You use needles to inject street drugs. ? You live with someone who has hepatitis B. ? You have had sex with someone who has hepatitis B. ? You get hemodialysis treatment. ? You take certain medicines for conditions, including cancer, organ transplantation, and autoimmune conditions.  Hepatitis C  Blood testing is recommended for: ? Everyone born from [birth year] through 1965. ? Anyone with known risk factors for hepatitis C.  Sexually transmitted infections (STIs)  You should be screened for sexually transmitted  infections (STIs) including gonorrhea and chlamydia if: ? You are sexually active and are younger than 61 years of age. ? You are older than 61 years of age and your health care provider tells you that you are at risk for this type of infection. ? Your sexual activity has changed since you were last screened and you are at an increased risk for chlamydia or gonorrhea. Ask your health care provider if you are at risk.  If you do not have HIV, but are at risk, it may be recommended that you take a prescription medicine daily to prevent HIV infection. This is called pre-exposure prophylaxis (PrEP). You are considered at risk if: ? You are sexually active and do not regularly use condoms or know the HIV status of your partner(s). ? You take drugs by injection. ? You are sexually active with a partner who has HIV.  Talk with your health care provider about whether you are at high risk of being infected with HIV. If you choose to begin PrEP, you should first be tested for HIV. You should then be tested every 3 months for as long as you are taking PrEP. Pregnancy  If you are premenopausal and you may become pregnant, ask your health care provider about preconception counseling.  If you may become pregnant, take 400 to 800 micrograms (mcg) of folic acid every day.  If you want to prevent pregnancy, talk to your health care provider about birth control (contraception). Osteoporosis and menopause  Osteoporosis is a disease in which the bones lose minerals and strength with aging. This can result in serious bone fractures. Your risk for osteoporosis can be identified using a bone density scan.  If you are 6 years of age or older, or if you are at risk for osteoporosis and fractures, ask your health care provider if you should be screened.  Ask your health care provider whether you should take a calcium or vitamin D supplement to lower your risk for osteoporosis.  Menopause may have certain physical  symptoms and risks.  Hormone replacement therapy may reduce some of these symptoms and risks. Talk to your health care provider about whether hormone replacement therapy is right for you. Follow these instructions at home:  Schedule regular health, dental, and eye exams.  Stay current with your immunizations.  Do not use any tobacco products including cigarettes, chewing tobacco, or electronic cigarettes.  If you are pregnant, do not drink alcohol.  If you are breastfeeding, limit how much and how often you drink alcohol.  Limit alcohol intake to no more than 1 drink per day for nonpregnant women. One drink equals 12 ounces of beer, 5 ounces of wine, or 1 ounces of hard liquor.  Do not use street drugs.  Do not share needles.  Ask your health care provider for help if you need support or information about quitting drugs.  Tell your health care provider if you often feel depressed.  Tell your health care provider if you have  ever been abused or do not feel safe at home. This information is not intended to replace advice given to you by your health care provider. Make sure you discuss any questions you have with your health care provider. Document Released: 11/17/2010 Document Revised: 10/10/2015 Document Reviewed: 02/05/2015 Elsevier Interactive Patient Education  [employer].

## 2017-04-27 MED FILL — GABAPENTIN 300 MG CAPSULE: 300 | 30 days supply | Qty: 30 | Fill #0

## 2017-04-28 ENCOUNTER — Ambulatory Visit (INDEPENDENT_AMBULATORY_CARE_PROVIDER_SITE_OTHER): Payer: Medicare Other | Admitting: Physician Assistant

## 2017-04-28 ENCOUNTER — Encounter: Payer: Self-pay | Admitting: Physician Assistant

## 2017-04-28 ENCOUNTER — Other Ambulatory Visit: Payer: Self-pay

## 2017-04-28 VITALS — BP 98/62 | HR 95 | Resp 14 | Ht 60.0 in | Wt 115.0 lb

## 2017-04-28 DIAGNOSIS — B029 Zoster without complications: Secondary | ICD-10-CM

## 2017-04-28 MED ORDER — VALACYCLOVIR HCL 1 G PO TABS
1000.0000 mg | ORAL_TABLET | Freq: Three times a day (TID) | ORAL | 0 refills | Status: DC
Start: 2017-04-28 — End: 2019-03-01

## 2017-04-28 MED FILL — valACYclovir HCL 1 GM TABS: 1 | 7 days supply | Qty: 21 | Fill #0

## 2017-04-28 NOTE — Patient Instructions (Signed)
Please stay well-hydrated and get plenty of rest. Continue Gabapentin as directed. Pain relievers as directed.  Keep area covered. Cold compresses will help with itch as will benadryl cream. Avoid any contact with elderly or pregnant individuals.    Shingles Shingles is an infection that causes a painful skin rash and fluid-filled blisters. Shingles is caused by the same virus that causes chickenpox. Shingles only develops in people who:  Have had chickenpox.  Have gotten the chickenpox vaccine. (This is rare.)  The first symptoms of shingles may be itching, tingling, or pain in an area on your skin. A rash will follow in a few days or weeks. The rash is usually on one side of the body in a bandlike or beltlike pattern. Over time, the rash turns into fluid-filled blisters that break open, scab over, and dry up. Medicines may:  Help you manage pain.  Help you recover more quickly.  Help to prevent long-term problems.  Follow these instructions at home: Medicines  Take medicines only as told by your doctor.  Apply an anti-itch or numbing cream to the affected area as told by your doctor. Blister and Rash Care  Take a cool bath or put cool compresses on the area of the rash or blisters as told by your doctor. This may help with pain and itching.  Keep your rash covered with a loose bandage (dressing). Wear loose-fitting clothing.  Keep your rash and blisters clean with mild soap and cool water or as told by your doctor.  Check your rash every day for signs of infection. These include redness, swelling, and pain that lasts or gets worse.  Do not pick your blisters.  Do not scratch your rash. General instructions  Rest as told by your doctor.  Keep all follow-up visits as told by your doctor. This is important.  Until your blisters scab over, your infection can cause chickenpox in people who have never had it or been vaccinated against it. To prevent this from happening,  avoid touching other people or being around other people, especially: ? Babies. ? Pregnant women. ? Children who have eczema. ? Elderly people who have transplants. ? People who have chronic illnesses, such as leukemia or AIDS. Contact a doctor if:  Your pain does not get better with medicine.  Your pain does not get better after the rash heals.  Your rash looks infected. Signs of infection include: ? Redness. ? Swelling. ? Pain that lasts or gets worse. Get help right away if:  The rash is on your face or nose.  You have pain in your face, pain around your eye area, or loss of feeling on one side of your face.  You have ear pain or you have ringing in your ear.  You have loss of taste.  Your condition gets worse. This information is not intended to replace advice given to you by your health care provider. Make sure you discuss any questions you have with your health care provider. Document Released: 10/21/2007 Document Revised: 12/29/2015 Document Reviewed: 02/13/2014 Elsevier Interactive Patient Education  [employer].

## 2017-04-28 NOTE — Progress Notes (Signed)
Patient presents to clinic today with care giver noting 3 days of a blistering rash of R lower back, hip and around to inner thigh. Rash is itchy per patient. Denies pain but is on chronic Gabapentin. Patient with history of chicken pox per caregiver. Is concerned about shingles. Denies patient without fever, chills, malaise or fatigue.  Past Medical History:  Diagnosis Date  . Acute confusion   . Chicken pox   . Depression     Current Outpatient Medications on File Prior to Visit  Medication Sig Dispense Refill  . Cholecalciferol (VITAMIN D-1000 MAX ST) 1000 units tablet Take 1,000 Units by mouth daily.     Marland Kitchen donepezil (ARICEPT) 10 MG tablet Take 10 mg by mouth at bedtime.   3  . escitalopram (LEXAPRO) 20 MG tablet Take 1 tablet (20 mg total) by mouth daily. 90 tablet 1  . gabapentin (NEURONTIN) 300 MG capsule Take 1 capsule (300 mg total) by mouth at bedtime. 30 capsule 2  . HYDROcodone-acetaminophen (NORCO) 10-325 MG tablet Take 0.5-1 tablets every 8 (eight) hours as needed by mouth. 10 tablet 0  . magnesium hydroxide (MILK OF MAGNESIA) 400 MG/5ML suspension Take 30 mLs by mouth daily as needed for mild constipation.    . meloxicam (MOBIC) 7.5 MG tablet   6  . memantine (NAMENDA XR) 28 MG CP24 24 hr capsule Take 1 capsule (28 mg total) by mouth daily. 90 capsule 1  . Multiple Vitamin (MULTIVITAMIN) tablet Take 1 tablet by mouth daily.    . multivitamin-lutein (OCUVITE-LUTEIN) CAPS capsule Take 1 capsule by mouth daily.    . simvastatin (ZOCOR) 20 MG tablet Take 1 tablet (20 mg total) by mouth at bedtime. 90 tablet 1  . UNABLE TO FIND daily. Med Name: Resveratrol     No current facility-administered medications on file prior to visit.     No Known Allergies  Family History  Problem Relation Age of Onset  . Diabetes Father        Deceased  . Cancer Father 69       Multiple Myeloma  . Heart defect Mother        Deceased  . Asthma Sister   . Heart defect Sister   . Heart  defect Brother   . Migraines Other        Familial  . Heart attack Sister   . Healthy Brother        x4  . Healthy Sister        x2  . Alzheimer's disease Maternal Grandfather        Dementia  . Liver disease Maternal Uncle   . Healthy Son        x1  . Colon cancer Neg Hx   . Esophageal cancer Neg Hx   . Rectal cancer Neg Hx   . Stomach cancer Neg Hx     Social History   Socioeconomic History  . Marital status: Widowed    Spouse name: None  . Number of children: None  . Years of education: None  . Highest education level: None  Social Needs  . Financial resource strain: None  . Food insecurity - worry: None  . Food insecurity - inability: None  . Transportation needs - medical: None  . Transportation needs - non-medical: None  Occupational History  . None  Tobacco Use  . Smoking status: Never Smoker  . Smokeless tobacco: Never Used  Substance and Sexual Activity  . Alcohol use: No  Comment: rare  . Drug use: No  . Sexual activity: No  Other Topics Concern  . None  Social History Narrative  . None   Review of Systems - See HPI.  All other ROS are negative.  BP 98/62   Pulse 95   Resp 14   Ht 5' (1.524 m)   Wt 115 lb (52.2 kg)   SpO2 96%   BMI 22.46 kg/m   Physical Exam  Constitutional: She is oriented to person, place, and time and well-developed, well-nourished, and in no distress.  HENT:  Head: Normocephalic and atraumatic.  Eyes: Conjunctivae are normal.  Neck: Neck supple.  Cardiovascular: Normal rate, regular rhythm, normal heart sounds and intact distal pulses.  Pulmonary/Chest: Effort normal and breath sounds normal. No respiratory distress. She has no wheezes. She has no rales. She exhibits no tenderness.  Neurological: She is alert and oriented to person, place, and time. No cranial nerve deficit.  Skin: Skin is warm and dry.     Psychiatric: Affect normal.  Vitals reviewed.  Recent Results (from the past 2160 hour(s))  CBC with  Differential [facility])     Status: None   Collection Time: 01/29/17  1:49 PM  Result Value Ref Range   WBC 4.6 3.9 - 10.0 10e3/uL   RBC 3.97 3.70 - 5.32 10e6/uL   HGB 12.4 11.6 - 15.9 g/dL   HCT 37.6 34.8 - 46.6 %   MCV 95 81 - 101 fL   MCH 31.2 26.0 - 34.0 pg   MCHC 33.0 32.0 - 36.0 g/dL   RDW 11.2 11.1 - 15.7 %   Platelets 208 145 - 400 10e3/uL   NEUT# 2.1 1.5 - 6.5 10e3/uL   LYMPH# 2.1 0.9 - 3.3 10e3/uL   MONO# 0.3 0.1 - 0.9 10e3/uL   Eosinophils Absolute 0.1 0.0 - 0.5 10e3/uL   BASO# 0.0 0.0 - 0.2 10e3/uL   NEUT% 45.3 39.6 - 80.0 %   LYMPH% 45.2 14.0 - 48.0 %   MONO% 6.4 0.0 - 13.0 %   EOS% 2.9 0.0 - 7.0 %   BASO% 0.2 0.0 - 2.0 %  CMP STAT ([facility] only)     Status: Abnormal   Collection Time: 01/29/17  1:49 PM  Result Value Ref Range   Sodium 143 128 - 145 mEq/L   Potassium 3.8 3.3 - 4.7 mEq/L   Chloride 106 98 - 108 mEq/L   CO2 28 18 - 33 mEq/L   Glucose, Bld 117 73 - 118 mg/dL   BUN, Bld 19 7 - 22 mg/dL   Creat 0.8 0.6 - 1.2 mg/dl   Total Bilirubin 1.20 0.20 - 1.60 mg/dl   Alkaline Phosphatase 94 (H) 26 - 84 U/L   AST 35 11 - 38 U/L   ALT(SGPT) 65 (H) 10 - 47 U/L   Total Protein 7.5 6.4 - 8.1 g/dL   Albumin 3.7 3.3 - 5.5 g/dL   Calcium 9.4 8.0 - 10.3 mg/dL  IgG, IgA, IgM     Status: Abnormal   Collection Time: 01/29/17  1:49 PM  Result Value Ref Range   IgG, Qn, Serum 1,300 700 - 1600 mg/dL   IgA, Qn, Serum 385 (H) 87 - 352 mg/dL   IgM, Qn, Serum 59 26 - 217 mg/dL  Beta 2 microglobulin, serum     Status: None   Collection Time: 01/29/17  1:49 PM  Result Value Ref Range   Beta-2 2.0 0.6 - 2.4 mg/L  Comment: Siemens Immulite 2000 Immunochemiluminometric assay [facility])  Kappa/lambda light chains     Status: None   Collection Time: 01/29/17  1:49 PM  Result Value Ref Range   Ig Kappa Free Light Chain 18.3 3.3 - 19.4 mg/L   Ig Lambda Free Light Chain 20.7 5.7 - 26.3 mg/L   Kappa/Lambda FluidC Ratio 0.88 0.26 - 1.65  Lactate  dehydrogenase     Status: None   Collection Time: 01/29/17  1:49 PM  Result Value Ref Range   LDH 196 125 - 245 U/L  Serum protein electrophoresis with reflex     Status: None   Collection Time: 01/29/17  1:49 PM  Result Value Ref Range   Total Protein 7.4 6.0 - 8.5 g/dL   Albumin 3.9 2.9 - 4.4 g/dL   Alpha 1 0.2 0.0 - 0.4 g/dL   Alpha 2 0.7 0.4 - 1.0 g/dL   Beta 1.3 0.7 - 1.3 g/dL   Gamma Globulin 1.3 0.4 - 1.8 g/dL   M-Spike, % Not Observed Not Observed g/dL   GLOBULIN, TOTAL 3.5 2.2 - 3.9 g/dL   A/G Ratio 1.1 0.7 - 1.7   Please Note: Comment     Comment: Protein electrophoresis scan will follow via computer, mail, or courier delivery.    Interpretation(See Below) Comment     Comment: The SPE pattern appears essentially unremarkable. Evidence of monoclonal protein is not apparent.   [facility] - Smear     Status: None   Collection Time: 01/29/17  1:49 PM  Result Value Ref Range   Smear Result Smear Available   Basic metabolic panel     Status: Abnormal   Collection Time: 03/02/17  9:11 AM  Result Value Ref Range   Sodium 139 135 - 145 mmol/L   Potassium 4.0 3.5 - 5.1 mmol/L   Chloride 103 101 - 111 mmol/L   CO2 28 22 - 32 mmol/L   Glucose, Bld 118 (H) 65 - 99 mg/dL   BUN 11 6 - 20 mg/dL   Creatinine, Ser 0.71 0.44 - 1.00 mg/dL   Calcium 9.5 8.9 - 10.3 mg/dL   GFR calc non Af Amer >60 >60 mL/min   GFR calc Af Amer >60 >60 mL/min    Comment: (NOTE) The eGFR has been calculated using the CKD EPI equation. This calculation has not been validated in all clinical situations. eGFR's persistently <60 mL/min signify possible Chronic Kidney Disease.    Anion gap 8 5 - 15  CBC with Differential/Platelet     Status: None   Collection Time: 03/02/17  9:11 AM  Result Value Ref Range   WBC 5.3 4.0 - 10.5 K/uL   RBC 4.41 3.87 - 5.11 MIL/uL   Hemoglobin 13.6 12.0 - 15.0 g/dL   HCT 40.9 36.0 - 46.0 %   MCV 92.7 78.0 - 100.0 fL   MCH 30.8 26.0 - 34.0 pg   MCHC 33.3 30.0 -  36.0 g/dL   RDW 12.2 11.5 - 15.5 %   Platelets 207 150 - 400 K/uL   Neutrophils Relative % 55 %   Neutro Abs 2.9 1.7 - 7.7 K/uL   Lymphocytes Relative 36 %   Lymphs Abs 1.9 0.7 - 4.0 K/uL   Monocytes Relative 7 %   Monocytes Absolute 0.4 0.1 - 1.0 K/uL   Eosinophils Relative 2 %   Eosinophils Absolute 0.1 0.0 - 0.7 K/uL   Basophils Relative 0 %   Basophils Absolute 0.0 0.0 - 0.1 K/uL  Protime-INR  Status: None   Collection Time: 03/02/17  9:11 AM  Result Value Ref Range   Prothrombin Time 12.7 11.4 - 15.2 seconds   INR 0.96   Chromosome Analysis, Bone Marrow     Status: None   Collection Time: 03/02/17 11:10 AM  Result Value Ref Range   Chromosome-Routine SEE SEPARATE REPORT     Comment: Performed at [facility]  Tissue hybridization (bone marrow)-[facility]     Status: None   Collection Time: 03/02/17 11:10 AM  Result Value Ref Range   Tissue hybridization (bone marrow)-[facility] SEE SEPARATE REPORT     Comment: Performed at [facility]    Assessment/Plan: 1. Herpes zoster without complication Start Valtrex TID x 7 days. Supportive measures and OTC medications reviewed. Continue Gabapentin as directed. Return precautions reviewed with patient's caregiver.   Leeanne Rio, PA-C

## 2017-05-13 ENCOUNTER — Telehealth: Payer: Self-pay | Admitting: Physician Assistant

## 2017-05-13 NOTE — Telephone Encounter (Signed)
PCP is not in the office this week.  Routing message to Dr. Jonni Sanger to see if she is able to to fill this.  If not, PCP can advise when he returns to the office.

## 2017-05-13 NOTE — Telephone Encounter (Signed)
Copied from [place] [phone]. Topic: Quick Communication - See Telephone Encounter >> May 13, 2017 12:31 PM Conception Chancy, NT wrote: CRM for notification. See Telephone encounter for:  05/13/17.  Patient brother is calling and stating pt is needing a refill on her hydrocodone-acetaminophen. Please advise

## 2017-05-14 NOTE — Telephone Encounter (Signed)
Reviewed chart. Started pain med for hip pain, then assessed and treated by Sports Medicine. Cannot refill. Will defer to PCP; can discuss pain medication with Dr. Ericka Pontiff office as well. Can schedule an appt with Einar Pheasant next week if needed to discuss further.

## 2017-05-14 NOTE — Telephone Encounter (Signed)
Per DPR - LM for brother letting him know that they need to contact Dr. Ericka Pontiff office for the hip pain.   If that does not work, they will need to schedule an appointment with PCP.

## 2017-05-17 ENCOUNTER — Telehealth: Payer: Self-pay | Admitting: Family Medicine

## 2017-05-17 NOTE — Telephone Encounter (Signed)
Ok to go ahead with this - thanks!

## 2017-05-17 NOTE — Telephone Encounter (Signed)
Patient's brother called and would like an order to be placed for an MRI of her lumbar spine. Would like MRI at [facility].

## 2017-05-19 NOTE — Addendum Note (Signed)
Addended by: Sherrie George F on: 05/19/2017 10:11 AM   Modules accepted: Orders

## 2017-05-20 NOTE — Telephone Encounter (Signed)
Order sent to [facility].

## 2017-05-24 ENCOUNTER — Telehealth: Payer: Self-pay | Admitting: Family Medicine

## 2017-05-24 MED ORDER — HYDROCODONE-ACETAMINOPHEN 7.5-325 MG PO TABS: 1.0000 | ORAL_TABLET | Freq: Four times a day (QID) | ORAL | 0 refills | Status: DC | PRN

## 2017-05-24 MED FILL — HYDROCODON-APAP 7.5-325: 7.5-325 | 5 days supply | Qty: 20 | Fill #0

## 2017-05-24 NOTE — Telephone Encounter (Signed)
Patient's brother was informed of Rx being sent to pharmacy and that it will not be refilled

## 2017-05-24 NOTE — Telephone Encounter (Signed)
I sent in a short course of norco - we don't refill this medication though.

## 2017-05-24 NOTE — Telephone Encounter (Signed)
Patient's brother calling requesting pain medication. States patient was previously prescribed Vicodin but has run out of Rx. Medication was prescribed by PCP. PCP was contacted first but PCP instructed him to reach out to our office.   MRI is scheduled for this Thursday, 05/27/2017  Pharmacy: [facility]

## 2017-05-25 MED FILL — MELOXICAM 7.5 MG TABLET: 7.5 | 30 days supply | Qty: 30 | Fill #2

## 2017-05-27 ENCOUNTER — Ambulatory Visit (HOSPITAL_COMMUNITY)
Admission: RE | Admit: 2017-05-27 | Discharge: 2017-05-27 | Disposition: A | Discharge status: Home / Self Care | Payer: Medicare Other | Source: Ambulatory Visit | Attending: Family Medicine | Admitting: Family Medicine

## 2017-05-27 DIAGNOSIS — M48061 Spinal stenosis, lumbar region without neurogenic claudication: Secondary | ICD-10-CM | POA: Diagnosis not present

## 2017-05-27 DIAGNOSIS — M25552 Pain in left hip: Secondary | ICD-10-CM | POA: Diagnosis not present

## 2017-05-27 DIAGNOSIS — M4807 Spinal stenosis, lumbosacral region: Secondary | ICD-10-CM | POA: Diagnosis not present

## 2017-05-27 DIAGNOSIS — M5126 Other intervertebral disc displacement, lumbar region: Secondary | ICD-10-CM | POA: Diagnosis not present

## 2017-05-28 MED ORDER — GABAPENTIN 300 MG PO CAPS: 300.0000 mg | ORAL_CAPSULE | Freq: Three times a day (TID) | ORAL | 1 refills | Status: DC

## 2017-05-28 MED FILL — GABAPENTIN 300 MG CAPSULE: 300 | 30 days supply | Qty: 90 | Fill #0

## 2017-05-28 NOTE — Progress Notes (Signed)
MRI reviewed and discussed with patient's brother.  She has moderate-severe narrowing mostly disc mediated on left side at L5-S1 and L4-5 levels.  We discussed options (ESI, PT, increased gabapentin, neurosurgery referral) - will start with increased gabapentin to 300 TID and he will call us next week to update [place] on how she's doing on this.  Continue mobic and has norco to take as needed.  She has difficulty following instructions so PT unlikely to be helpful.  ESI likely next step if not improving as we increase gabapentin.

## 2017-05-28 NOTE — Addendum Note (Signed)
Addended by: Dene Gentry on: 05/28/2017 12:36 PM   Modules accepted: Orders

## 2017-06-11 ENCOUNTER — Telehealth: Payer: Self-pay | Admitting: Family Medicine

## 2017-06-11 NOTE — Telephone Encounter (Signed)
That's good - If she's tolerating the gabapentin at 300mg  three times a day I'd recommend she double it to 2 capsules (so 600mg ) three times a day unless it makes her too sleepy.  If she needs more of the medication to cover this I can send it in.  Then continue ibuprofen if needed beyond this and vicodin/norco rarely for severe pain.

## 2017-06-11 NOTE — Telephone Encounter (Signed)
Patient's brother calling to report on patient's pain and use of gabapentin Rx.  Brother states that she is managing her pain a little better. Also states when her pain increases she uses ibuprofen more so than the Vicodin now

## 2017-06-11 NOTE — Telephone Encounter (Signed)
Spoke with patient's brother. He will start her on the 2 capsules three times a day. Requested a refill to be sent to [facility] delivery service

## 2017-06-14 MED ORDER — GABAPENTIN 300 MG PO CAPS: 600.0000 mg | ORAL_CAPSULE | Freq: Three times a day (TID) | ORAL | 1 refills | Status: DC

## 2017-06-14 NOTE — Telephone Encounter (Signed)
Ok I sent the increased amount to [facility].  Thanks!

## 2017-06-15 ENCOUNTER — Ambulatory Visit (HOSPITAL_BASED_OUTPATIENT_CLINIC_OR_DEPARTMENT_OTHER)

## 2017-06-17 ENCOUNTER — Ambulatory Visit (HOSPITAL_BASED_OUTPATIENT_CLINIC_OR_DEPARTMENT_OTHER): Payer: Medicare Other

## 2017-06-23 ENCOUNTER — Ambulatory Visit (HOSPITAL_BASED_OUTPATIENT_CLINIC_OR_DEPARTMENT_OTHER)
Admission: RE | Admit: 2017-06-23 | Discharge: 2017-06-23 | Disposition: A | Discharge status: Home / Self Care | Payer: Medicare Other | Source: Ambulatory Visit | Attending: Medical | Admitting: Medical

## 2017-06-23 DIAGNOSIS — Z1231 Encounter for screening mammogram for malignant neoplasm of breast: Secondary | ICD-10-CM | POA: Diagnosis not present

## 2017-06-23 MED FILL — MELOXICAM 7.5 MG TABLET: 7.5 | 30 days supply | Qty: 30 | Fill #3

## 2017-07-24 ENCOUNTER — Other Ambulatory Visit: Payer: Self-pay | Admitting: Physician Assistant

## 2017-07-25 NOTE — Telephone Encounter (Signed)
Please contact patient's caregiver (brother). Namenda Rx sent. She needs lab only visit for lipids and LFTS so we can continue simvastatin.

## 2017-07-26 ENCOUNTER — Encounter: Payer: Self-pay | Admitting: Emergency Medicine

## 2017-07-26 ENCOUNTER — Other Ambulatory Visit: Payer: Self-pay | Admitting: Physician Assistant

## 2017-07-26 DIAGNOSIS — E785 Hyperlipidemia, unspecified: Secondary | ICD-10-CM

## 2017-07-26 NOTE — Telephone Encounter (Signed)
My chart message sent to patient caregiver to schedule a lab visit. Future orders placed in chart.

## 2017-07-27 ENCOUNTER — Encounter: Payer: Self-pay | Admitting: Neurology

## 2017-07-27 ENCOUNTER — Ambulatory Visit (INDEPENDENT_AMBULATORY_CARE_PROVIDER_SITE_OTHER): Payer: Medicare Other | Admitting: Neurology

## 2017-07-27 VITALS — BP 124/74 | HR 76 | Resp 16 | Ht 60.0 in | Wt 117.0 lb

## 2017-07-27 DIAGNOSIS — F0280 Dementia in other diseases classified elsewhere without behavioral disturbance: Secondary | ICD-10-CM

## 2017-07-27 DIAGNOSIS — R6889 Other general symptoms and signs: Secondary | ICD-10-CM

## 2017-07-27 DIAGNOSIS — G300 Alzheimer's disease with early onset: Secondary | ICD-10-CM | POA: Diagnosis not present

## 2017-07-27 MED ORDER — MEMANTINE HCL ER 28 MG PO CP24: 28.0000 mg | ORAL_CAPSULE | Freq: Every day | ORAL | 3 refills | Status: DC

## 2017-07-27 MED ORDER — ESCITALOPRAM OXALATE 20 MG PO TABS: 20.0000 mg | ORAL_TABLET | Freq: Every day | ORAL | 3 refills | Status: DC

## 2017-07-27 MED ORDER — DONEPEZIL HCL 10 MG PO TABS: 10.0000 mg | ORAL_TABLET | Freq: Every day | ORAL | 3 refills | Status: DC

## 2017-07-27 MED FILL — MELOXICAM 7.5 MG TABLET: 7.5 | 30 days supply | Qty: 30 | Fill #4

## 2017-07-27 NOTE — Patient Instructions (Addendum)
1.  Aricept, Namenda and escitalopram refilled with 90 day supply  2.  We will check MRI of brain and routine EEG      We have sent a referral to [facility] for your MRI and they will call you directly to schedule your appt. They      are located at [place]. If you need to contact them directly please call [phone].  3.  Follow up in 6 months.
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
## 2017-08-05 ENCOUNTER — Ambulatory Visit (INDEPENDENT_AMBULATORY_CARE_PROVIDER_SITE_OTHER): Payer: Medicare Other | Admitting: Neurology

## 2017-08-05 DIAGNOSIS — F0280 Dementia in other diseases classified elsewhere without behavioral disturbance: Secondary | ICD-10-CM | POA: Diagnosis not present

## 2017-08-05 DIAGNOSIS — G300 Alzheimer's disease with early onset: Secondary | ICD-10-CM | POA: Diagnosis not present

## 2017-08-05 DIAGNOSIS — R6889 Other general symptoms and signs: Secondary | ICD-10-CM

## 2017-08-17 ENCOUNTER — Ambulatory Visit
Admission: RE | Admit: 2017-08-17 | Discharge: 2017-08-17 | Disposition: A | Discharge status: Home / Self Care | Payer: Medicare Other | Source: Ambulatory Visit | Attending: Neurology | Admitting: Neurology

## 2017-08-17 DIAGNOSIS — R6889 Other general symptoms and signs: Secondary | ICD-10-CM

## 2017-08-17 DIAGNOSIS — G301 Alzheimer's disease with late onset: Secondary | ICD-10-CM | POA: Diagnosis not present

## 2017-08-17 DIAGNOSIS — F0280 Dementia in other diseases classified elsewhere without behavioral disturbance: Secondary | ICD-10-CM

## 2017-08-17 DIAGNOSIS — G300 Alzheimer's disease with early onset: Principal | ICD-10-CM

## 2017-08-19 ENCOUNTER — Telehealth: Payer: Self-pay

## 2017-08-19 NOTE — Telephone Encounter (Signed)
Spoke with [place], [place]. Advsd him of MRI results.

## 2017-08-19 NOTE — Telephone Encounter (Signed)
-----   Message from Annamaria Helling, [place] sent at 08/18/2017  8:39 AM EDT ----- Carlyon Prows please call.

## 2017-08-19 NOTE — Telephone Encounter (Signed)
Called and LM on VM for rtrn call

## 2017-08-26 ENCOUNTER — Telehealth: Payer: Self-pay | Admitting: Family Medicine

## 2017-08-26 NOTE — Telephone Encounter (Signed)
Is she taking 600mg  three times a day now of the gabapentin?  I just want to make sure I send in enough.  And does he want me to send in 90 days worth?

## 2017-08-26 NOTE — Telephone Encounter (Signed)
Informed patient's bother that we do not refill Norco. He asked if a refill of the gabapentin could be sent in through [facility] once they are close to being out, currently have enough for 15 more days.  Stated the gabapentin does help with her pain but on days when the pain was extreme they would use the Norco

## 2017-08-26 NOTE — Telephone Encounter (Signed)
While I understand she's using this rarely, as we advised on 1/7, we would not refill this medication.  We do not prescribe narcotic pain medications on a continual basis for chronic pain.  The goal was to treat with non-narcotics like the gabapentin and titrate this up.

## 2017-08-26 NOTE — Telephone Encounter (Signed)
Patient's brother is requesting a refill of Hydrocodone. States last refill was on January 7th and she has 1 1/2 tablets left.

## 2017-08-27 NOTE — Telephone Encounter (Signed)
Left message requesting a return call.

## 2017-08-31 MED ORDER — GABAPENTIN 300 MG PO CAPS
600.0000 mg | ORAL_CAPSULE | Freq: Three times a day (TID) | ORAL | 0 refills | Status: DC
Start: 2017-08-31 — End: 2017-12-02

## 2017-08-31 MED FILL — MELOXICAM 7.5 MG TABLET: 7.5 | 30 days supply | Qty: 30 | Fill #5

## 2017-08-31 NOTE — Telephone Encounter (Signed)
Spoke to patient's brother and she is taking 600mg  tablets three times a day and they would like a 90 days worth supply. Also asked if you would put any refills on this prescription or if she needs to come in to be reevaluated.   Brother also has concerns with not refilling the Norco. States the patient has very painful episodes every now and then and she takes Ibuprofen and Meloxicam but these do not help with the pain. Asking what you advise in place of the Norco

## 2017-08-31 NOTE — Telephone Encounter (Signed)
I sent in 90 days worth with 0 refills - I'd reevaluate her near the end of this (maybe beginning of July).  The options for pain in addition to this are the ibuprofen 600mg  three times a day with food, plus tylenol 1000mg  three times a day as needed.  Topical medication like capsaicin up to 4 times a day can be used as well.  We could increase the dose of gabapentin if necessary also.

## 2017-09-01 ENCOUNTER — Other Ambulatory Visit (INDEPENDENT_AMBULATORY_CARE_PROVIDER_SITE_OTHER): Payer: Medicare Other

## 2017-09-01 DIAGNOSIS — E785 Hyperlipidemia, unspecified: Secondary | ICD-10-CM

## 2017-09-01 LAB — LIPID PANEL
CHOL/HDL RATIO: 4
Cholesterol: 206 mg/dL — ABNORMAL HIGH (ref 0–200)
HDL: 51.2 mg/dL (ref >39.00)
LDL CALC: 119 mg/dL — AB (ref 0–99)
NonHDL: 155.04
TRIGLYCERIDES: 179 mg/dL — AB (ref 0.0–149.0)
VLDL: 35.8 mg/dL (ref 0.0–40.0)

## 2017-09-01 LAB — HEPATIC FUNCTION PANEL
ALT: 22 U/L (ref 0–35)
AST: 16 U/L (ref 0–37)
Albumin: 4.3 g/dL (ref 3.5–5.2)
Alkaline Phosphatase: 49 U/L (ref 39–117)
BILIRUBIN DIRECT: 0.1 mg/dL (ref 0.0–0.3)
BILIRUBIN TOTAL: 0.7 mg/dL (ref 0.2–1.2)
TOTAL PROTEIN: 7.3 g/dL (ref 6.0–8.3)

## 2017-09-01 NOTE — Telephone Encounter (Signed)
Patient's brother was informed. No concerns or questions at this time

## 2017-09-13 ENCOUNTER — Encounter: Payer: Self-pay | Admitting: Neurology

## 2017-09-17 ENCOUNTER — Telehealth: Payer: Self-pay | Admitting: Emergency Medicine

## 2017-09-17 ENCOUNTER — Other Ambulatory Visit: Payer: Self-pay | Admitting: *Deleted

## 2017-09-17 ENCOUNTER — Telehealth: Payer: Self-pay | Admitting: Physician Assistant

## 2017-09-17 MED ORDER — SIMVASTATIN 20 MG PO TABS: 20.0000 mg | ORAL_TABLET | Freq: Every day | ORAL | 1 refills | Status: DC

## 2017-09-17 NOTE — Telephone Encounter (Signed)
Copied from [place] [id number]. Topic: Quick Communication - Rx Refill/Question >> Sep 17, 2017  8:22 AM Arletha Grippe wrote: Medication: simvastatin (ZOCOR) 20 MG tablet Has the patient contacted their pharmacy? Yes.   (Agent: If no, request that the patient contact the pharmacy for the refill.) Preferred Pharmacy (with phone number [facility]  Agent: Please be advised that RX refills may take up to 3 business days. We ask that you follow-up with your pharmacy. Called pharm - said rx was expired

## 2017-09-17 NOTE — Telephone Encounter (Signed)
When would like patient to come back in. Please advise  Copied from [place] [phone]. Topic: Appointment Scheduling - Scheduling Inquiry for Clinic >> Sep 17, 2017  8:26 AM Arletha Grippe wrote: Reason for CRM: brother (poa) called - he is wanting to know when the pt needs to have her next appt - it was not listed on the last avs. Please call to schedule.

## 2017-09-17 NOTE — Telephone Encounter (Signed)
Follow-up in June - [place] with Maudie Mercury and follow-up with me same day.

## 2017-09-17 NOTE — Telephone Encounter (Signed)
Left detailed message on patient Brother's Reynaldo VM that patient is due for appointment in June. She can schedule Medicare Wellness Visit with Maudie Mercury [facility]) then follow up appointment with PCP afterwards.

## 2017-09-17 NOTE — Telephone Encounter (Signed)
Rx refilled per protocol. LOV: 01/21/17 lab 09/01/17

## 2017-09-20 ENCOUNTER — Inpatient Hospital Stay: Payer: Medicare Other | Attending: Hematology & Oncology | Admitting: Hematology & Oncology

## 2017-09-20 ENCOUNTER — Inpatient Hospital Stay: Payer: Medicare Other

## 2017-09-29 MED FILL — MELOXICAM 7.5 MG TABLET: 7.5 | 30 days supply | Qty: 30 | Fill #6

## 2017-10-07 NOTE — Procedures (Signed)
ELECTROENCEPHALOGRAM REPORT  Date of Study: 08/05/2017  Patient's Name: Alexandria Oliver MRN: [id number] Date of Birth: Jun 25, 1955  Referring Provider: Metta Clines, DO  Clinical History: 62 year old woman with early-onset Alzheimer's disease presenting with episodes of decreased attentiveness and catatonia  Medications: Donepezil memantine Gabapentin Hydrocodone-acetaminophen  Technical Summary: A multichannel digital EEG recording measured by the international 10-20 system with electrodes applied with paste and impedances below 5000 ohms performed as portable with EKG monitoring in an awake and drowsy patient.  Hyperventilation and photic stimulation were performed.  The digital EEG was referentially recorded, reformatted, and digitally filtered in a variety of bipolar and referential montages for optimal display.   Description: The patient is awake and drowsy during the recording.  During maximal wakefulness, there is a symmetric, medium voltage 5-6 Hz posterior dominant rhythm that attenuates with eye opening. This is admixed with diffuse 4-5 Hz theta and 2-3 Hz delta slowing of the waking background.  During drowsiness, there is an increase in theta slowing of the background.  Stage 2 sleep is not seen.  Hyperventilation and photic stimulation did not elicit any abnormalities.  There were no epileptiform discharges or electrographic seizures seen.    EKG lead was unremarkable.  Impression: This awake and drowsy EEG is abnormal due to diffuse slowing of the waking background.  Clinical Correlation of the above findings indicates diffuse cerebral dysfunction that is non-specific in etiology and can be seen with hypoxic/ischemic injury, toxic/metabolic encephalopathies, neurodegenerative disorders, or medication effect.  The absence of epileptiform discharges does not rule out a clinical diagnosis of epilepsy.  Clinical correlation is advised.   Metta Clines, DO

## 2017-11-08 ENCOUNTER — Other Ambulatory Visit: Payer: Self-pay | Admitting: Hematology & Oncology

## 2017-11-08 DIAGNOSIS — D472 Monoclonal gammopathy: Secondary | ICD-10-CM

## 2017-11-08 MED FILL — MELOXICAM 7.5 MG TABLET: 7.5 | 30 days supply | Qty: 30 | Fill #0

## 2017-11-30 ENCOUNTER — Telehealth: Payer: Self-pay

## 2017-11-30 NOTE — Telephone Encounter (Signed)
Patients brother (POA) requesting handicap placard. Completed by PCP, advised brother it is available for pick up at front desk. Verbalized understanding.

## 2017-12-02 ENCOUNTER — Ambulatory Visit (INDEPENDENT_AMBULATORY_CARE_PROVIDER_SITE_OTHER): Payer: Medicare Other | Admitting: Family Medicine

## 2017-12-02 ENCOUNTER — Encounter: Payer: Self-pay | Admitting: Family Medicine

## 2017-12-02 DIAGNOSIS — M545 Low back pain, unspecified: Secondary | ICD-10-CM

## 2017-12-02 DIAGNOSIS — M79605 Pain in left leg: Secondary | ICD-10-CM

## 2017-12-02 MED ORDER — GABAPENTIN 300 MG PO CAPS: 600.0000 mg | ORAL_CAPSULE | Freq: Three times a day (TID) | ORAL | 3 refills | Status: DC

## 2017-12-02 MED FILL — MELOXICAM 7.5 MG TABLET: 7.5 | 30 days supply | Qty: 30 | Fill #1

## 2017-12-02 MED FILL — MELOXICAM 7.5 MG TABLET: 7.5 | 30 days supply | Qty: 30 | Fill #2

## 2017-12-02 NOTE — Patient Instructions (Signed)
Continue the gabapentin and meloxicam. Ok to take ibuprofen instead of meloxicam if this seems to help better. If you're still doing well without complaints of pain over the next 2-3 months you can taper off the gabapentin and see how you do (you would back down to taking 300mg  three times a day for a week then stop). Follow up with me in 1 year at the latest but call me if you need anything.

## 2017-12-03 ENCOUNTER — Encounter: Payer: Self-pay | Admitting: Family Medicine

## 2017-12-03 NOTE — Progress Notes (Signed)
PCP and consultation requested by: Brunetta Jeans, PA-C  Subjective:   HPI: Patient is a 62 y.o. female here for left hip pain.  03/31/17: Patient is here with her brother who provided most of the history.   They note she's complained of about 4 months of left hip pain. Worse with lying on the side, standing, transferring positions. Has tried mobic and home exercises with minimal benefit. Pain is currently 0/10 but can be sharp and severe laterally. They report she may have stepped on a pebble and slipped but no injury date and this is not certain. She had negative MRI of this hip. No skin changes, numbness.  12/02/17: Patient is here with her brother. He reports her left hip/back are much improved. She complains of pain about once a month now and severity is minimal compared to her prior visit. She is taking gabapentin 600 tid and either mobic or ibuprofen if needed. Pain level 0/10 currently. No skin changes. No new injuries or complaints.  Past Medical History:  Diagnosis Date  . Acute confusion   . Chicken pox   . Depression     Current Outpatient Medications on File Prior to Visit  Medication Sig Dispense Refill  . Cholecalciferol (VITAMIN D-1000 MAX ST) 1000 units tablet Take 1,000 Units by mouth daily.     Marland Kitchen donepezil (ARICEPT) 10 MG tablet Take 1 tablet (10 mg total) by mouth at bedtime. 90 tablet 3  . escitalopram (LEXAPRO) 20 MG tablet Take 1 tablet (20 mg total) by mouth daily. 90 tablet 3  . HYDROcodone-acetaminophen (NORCO) 7.5-325 MG tablet Take 1 tablet by mouth every 6 (six) hours as needed for moderate pain. 20 tablet 0  . magnesium hydroxide (MILK OF MAGNESIA) 400 MG/5ML suspension Take 30 mLs by mouth daily as needed for mild constipation.    . meloxicam (MOBIC) 7.5 MG tablet   6  . meloxicam (MOBIC) 7.5 MG tablet TAKE 1 TABLET (7.5 MG TOTAL) BY MOUTH DAILY. 30 tablet 6  . memantine (NAMENDA XR) 28 MG CP24 24 hr capsule Take 1 capsule (28 mg total) by  mouth daily. 90 capsule 3  . Multiple Vitamin (MULTIVITAMIN) tablet Take 1 tablet by mouth daily.    . multivitamin-lutein (OCUVITE-LUTEIN) CAPS capsule Take 1 capsule by mouth daily.    . simvastatin (ZOCOR) 20 MG tablet Take 1 tablet (20 mg total) by mouth at bedtime. 90 tablet 1  . UNABLE TO FIND daily. Med Name: Resveratrol    . valACYclovir (VALTREX) 1000 MG tablet Take 1 tablet (1,000 mg total) by mouth 3 (three) times daily. 21 tablet 0   No current facility-administered medications on file prior to visit.     Past Surgical History:  Procedure Laterality Date  . Unremarkable.      No Known Allergies  Social History   Socioeconomic History  . Marital status: Widowed    Spouse name: Not on file  . Number of children: Not on file  . Years of education: Not on file  . Highest education level: Not on file  Occupational History  . Not on file  Social Needs  . Financial resource strain: Not on file  . Food insecurity:    Worry: Not on file    Inability: Not on file  . Transportation needs:    Medical: Not on file    Non-medical: Not on file  Tobacco Use  . Smoking status: Never Smoker  . Smokeless tobacco: Never Used  Substance and Sexual Activity  .  Alcohol use: No    Comment: rare  . Drug use: No  . Sexual activity: Never  Lifestyle  . Physical activity:    Days per week: Not on file    Minutes per session: Not on file  . Stress: Not on file  Relationships  . Social connections:    Talks on phone: Not on file    Gets together: Not on file    Attends religious service: Not on file    Active member of club or organization: Not on file    Attends meetings of clubs or organizations: Not on file    Relationship status: Not on file  . Intimate partner violence:    Fear of current or ex partner: Not on file    Emotionally abused: Not on file    Physically abused: Not on file    Forced sexual activity: Not on file  Other Topics Concern  . Not on file  Social  History Narrative  . Not on file    Family History  Problem Relation Age of Onset  . Diabetes Father        Deceased  . Cancer Father 51       Multiple Myeloma  . Heart defect Mother        Deceased  . Asthma Sister   . Heart defect Sister   . Heart defect Brother   . Migraines Other        Familial  . Heart attack Sister   . Healthy Brother        x4  . Healthy Sister        x2  . Alzheimer's disease Maternal Grandfather        Dementia  . Liver disease Maternal Uncle   . Healthy Son        x1  . Colon cancer Neg Hx   . Esophageal cancer Neg Hx   . Rectal cancer Neg Hx   . Stomach cancer Neg Hx     BP 121/81   Pulse 78   Ht 5' (1.524 m)   Wt 118 lb 6.4 oz (53.7 kg)   BMI 23.12 kg/m   Review of Systems: See HPI above.     Objective:  Physical Exam:  Gen: NAD, comfortable in exam room.  Difficulty following commands.  Back/left hip: No gross deformity, scoliosis. No TTP .  No midline or bony TTP. FROM. Full strength bilateral lower extremities. Negative SLRs. Sensation intact to light touch bilaterally. Negative logroll bilateral hips   Assessment & Plan:  1. Back/left hip pain - consistent with lumbar radiculopathy.  Much improved with gabapentin, using either ibuprofen or mobic.  Continue current treatment.  We discussed consideration over next 2-3 months of tapering her off the gabapentin and how we would do this.  Otherwise, refilled her gabapentin.  F/u in 1 year at latest but call us with any questions.

## 2017-12-03 NOTE — Assessment & Plan Note (Signed)
consistent with lumbar radiculopathy.  Much improved with gabapentin, using either ibuprofen or mobic.  Continue current treatment.  We discussed consideration over next 2-3 months of tapering her off the gabapentin and how we would do this.  Otherwise, refilled her gabapentin.  F/u in 1 year at latest but call us with any questions.

## 2017-12-10 ENCOUNTER — Other Ambulatory Visit: Payer: Self-pay

## 2017-12-10 MED ORDER — MEMANTINE HCL ER 28 MG PO CP24: 28.0000 mg | ORAL_CAPSULE | Freq: Every day | ORAL | 0 refills | Status: DC

## 2018-01-27 ENCOUNTER — Ambulatory Visit: Payer: Medicare Other | Admitting: Neurology

## 2018-02-23 ENCOUNTER — Encounter: Payer: Self-pay | Admitting: Physician Assistant

## 2018-02-23 ENCOUNTER — Other Ambulatory Visit: Payer: Self-pay

## 2018-02-23 ENCOUNTER — Ambulatory Visit (INDEPENDENT_AMBULATORY_CARE_PROVIDER_SITE_OTHER): Payer: Medicare Other | Admitting: Physician Assistant

## 2018-02-23 VITALS — BP 102/70 | HR 51 | Temp 97.5°F | Resp 14 | Ht 60.0 in | Wt 114.0 lb

## 2018-02-23 DIAGNOSIS — F32A Depression, unspecified: Secondary | ICD-10-CM

## 2018-02-23 DIAGNOSIS — F0280 Dementia in other diseases classified elsewhere without behavioral disturbance: Secondary | ICD-10-CM | POA: Diagnosis not present

## 2018-02-23 DIAGNOSIS — Z23 Encounter for immunization: Secondary | ICD-10-CM | POA: Diagnosis not present

## 2018-02-23 DIAGNOSIS — F329 Major depressive disorder, single episode, unspecified: Secondary | ICD-10-CM

## 2018-02-23 DIAGNOSIS — E785 Hyperlipidemia, unspecified: Secondary | ICD-10-CM

## 2018-02-23 DIAGNOSIS — G300 Alzheimer's disease with early onset: Secondary | ICD-10-CM

## 2018-02-23 DIAGNOSIS — Z0000 Encounter for general adult medical examination without abnormal findings: Secondary | ICD-10-CM | POA: Diagnosis not present

## 2018-02-23 LAB — COMPREHENSIVE METABOLIC PANEL
ALT: 47 U/L — AB (ref 0–35)
AST: 28 U/L (ref 0–37)
Albumin: 4.5 g/dL (ref 3.5–5.2)
Alkaline Phosphatase: 83 U/L (ref 39–117)
BUN: 21 mg/dL (ref 6–23)
CO2: 33 meq/L — AB (ref 19–32)
CREATININE: 0.71 mg/dL (ref 0.40–1.20)
Calcium: 9.9 mg/dL (ref 8.4–10.5)
Chloride: 104 mEq/L (ref 96–112)
GFR: 88.69 mL/min (ref >60.00)
Glucose, Bld: 110 mg/dL — ABNORMAL HIGH (ref 70–99)
Potassium: 4 mEq/L (ref 3.5–5.1)
SODIUM: 142 meq/L (ref 135–145)
TOTAL PROTEIN: 7.7 g/dL (ref 6.0–8.3)
Total Bilirubin: 0.6 mg/dL (ref 0.2–1.2)

## 2018-02-23 LAB — CBC WITH DIFFERENTIAL/PLATELET
Basophils Absolute: 0 10*3/uL (ref 0.0–0.1)
Basophils Relative: 0.4 % (ref 0.0–3.0)
EOS ABS: 0.2 10*3/uL (ref 0.0–0.7)
Eosinophils Relative: 3.7 % (ref 0.0–5.0)
HCT: 39.4 % (ref 36.0–46.0)
Hemoglobin: 13 g/dL (ref 12.0–15.0)
LYMPHS ABS: 2.2 10*3/uL (ref 0.7–4.0)
LYMPHS PCT: 50.9 % — AB (ref 12.0–46.0)
MCHC: 33 g/dL (ref 30.0–36.0)
MCV: 93.9 fl (ref 78.0–100.0)
MONOS PCT: 6.9 % (ref 3.0–12.0)
Monocytes Absolute: 0.3 10*3/uL (ref 0.1–1.0)
NEUTROS PCT: 38.1 % — AB (ref 43.0–77.0)
Neutro Abs: 1.6 10*3/uL (ref 1.4–7.7)
Platelets: 205 10*3/uL (ref 150.0–400.0)
RBC: 4.19 Mil/uL (ref 3.87–5.11)
RDW: 12.3 % (ref 11.5–15.5)
WBC: 4.3 10*3/uL (ref 4.0–10.5)

## 2018-02-23 LAB — LIPID PANEL
CHOLESTEROL: 215 mg/dL — AB (ref 0–200)
HDL: 42.4 mg/dL (ref >39.00)
LDL CALC: 149 mg/dL — AB (ref 0–99)
NonHDL: 173.03
TRIGLYCERIDES: 122 mg/dL (ref 0.0–149.0)
Total CHOL/HDL Ratio: 5
VLDL: 24.4 mg/dL (ref 0.0–40.0)

## 2018-02-23 LAB — TSH: TSH: 0.74 u[IU]/mL (ref 0.35–4.50)

## 2018-02-23 NOTE — Patient Instructions (Signed)
Please go to the lab for blood work.   Our office will call you with your results unless you have chosen to receive results via MyChart.  If your blood work is normal we will follow-up each year for physicals and as scheduled for chronic medical problems.  If anything is abnormal we will treat accordingly and get you in for a follow-up.  Flu shot was updated today. Speak to your pharmacist about getting the Shingrix vaccine.   Preventive Care 40-64 Years, Female Preventive care refers to lifestyle choices and visits with your health care provider that can promote health and wellness. What does preventive care include?  A yearly physical exam. This is also called an annual well check.  Dental exams once or twice a year.  Routine eye exams. Ask your health care provider how often you should have your eyes checked.  Personal lifestyle choices, including: ? Daily care of your teeth and gums. ? Regular physical activity. ? Eating a healthy diet. ? Avoiding tobacco and drug use. ? Limiting alcohol use. ? Practicing safe sex. ? Taking low-dose aspirin daily starting at age 83. ? Taking vitamin and mineral supplements as recommended by your health care provider. What happens during an annual well check? The services and screenings done by your health care provider during your annual well check will depend on your age, overall health, lifestyle risk factors, and family history of disease. Counseling Your health care provider may ask you questions about your:  Alcohol use.  Tobacco use.  Drug use.  Emotional well-being.  Home and relationship well-being.  Sexual activity.  Eating habits.  Work and work Statistician.  Method of birth control.  Menstrual cycle.  Pregnancy history.  Screening You may have the following tests or measurements:  Height, weight, and BMI.  Blood pressure.  Lipid and cholesterol levels. These may be checked every 5 years, or more  frequently if you are over 107 years old.  Skin check.  Lung cancer screening. You may have this screening every year starting at age 34 if you have a 30-pack-year history of smoking and currently smoke or have quit within the past 15 years.  Fecal occult blood test (FOBT) of the stool. You may have this test every year starting at age 26.  Flexible sigmoidoscopy or colonoscopy. You may have a sigmoidoscopy every 5 years or a colonoscopy every 10 years starting at age 6.  Hepatitis C blood test.  Hepatitis B blood test.  Sexually transmitted disease (STD) testing.  Diabetes screening. This is done by checking your blood sugar (glucose) after you have not eaten for a while (fasting). You may have this done every 1-3 years.  Mammogram. This may be done every 1-2 years. Talk to your health care provider about when you should start having regular mammograms. This may depend on whether you have a family history of breast cancer.  BRCA-related cancer screening. This may be done if you have a family history of breast, ovarian, tubal, or peritoneal cancers.  Pelvic exam and Pap test. This may be done every 3 years starting at age 10. Starting at age 68, this may be done every 5 years if you have a Pap test in combination with an HPV test.  Bone density scan. This is done to screen for osteoporosis. You may have this scan if you are at high risk for osteoporosis.  Discuss your test results, treatment options, and if necessary, the need for more tests with your health care provider.  Vaccines Your health care provider may recommend certain vaccines, such as:  Influenza vaccine. This is recommended every year.  Tetanus, diphtheria, and acellular pertussis (Tdap, Td) vaccine. You may need a Td booster every 10 years.  Varicella vaccine. You may need this if you have not been vaccinated.  Zoster vaccine. You may need this after age 65.  Measles, mumps, and rubella (MMR) vaccine. You may need  at least one dose of MMR if you were born in 1957 or later. You may also need a second dose.  Pneumococcal 13-valent conjugate (PCV13) vaccine. You may need this if you have certain conditions and were not previously vaccinated.  Pneumococcal polysaccharide (PPSV23) vaccine. You may need one or two doses if you smoke cigarettes or if you have certain conditions.  Meningococcal vaccine. You may need this if you have certain conditions.  Hepatitis A vaccine. You may need this if you have certain conditions or if you travel or work in places where you may be exposed to hepatitis A.  Hepatitis B vaccine. You may need this if you have certain conditions or if you travel or work in places where you may be exposed to hepatitis B.  Haemophilus influenzae type b (Hib) vaccine. You may need this if you have certain conditions.  Talk to your health care provider about which screenings and vaccines you need and how often you need them. This information is not intended to replace advice given to you by your health care provider. Make sure you discuss any questions you have with your health care provider. Document Released: 05/31/2015 Document Revised: 01/22/2016 Document Reviewed: 03/05/2015 Elsevier Interactive Patient Education  [employer]. .

## 2018-02-23 NOTE — Progress Notes (Signed)
Subjective:   Alexandria Oliver is a 62 y.o. female who presents for Medicare Annual (Subsequent) preventive examination.  Review of Systems:  Unable to obtain complete ROS due to advancing dementia. Brother denies any noted changes or complaints from patient.   Body mass index is 22.26 kg/m.  Objective:     Vitals: BP 102/70   Pulse (!) 51   Temp (!) 97.5 F (36.4 C) (Oral)   Resp 14   Ht 5' (1.524 m)   Wt 114 lb (51.7 kg)   SpO2 97%   BMI 22.26 kg/m   Body mass index is 22.26 kg/m.  Advanced Directives 02/23/2018 04/22/2017 03/02/2017 01/29/2017 08/03/2016 11/13/2013  Does Patient Have a Medical Advance Directive? No No Yes No - Patient would not like information  Type of Advance Directive - - Healthcare Power of [place] -  Does patient want to make changes to medical advance directive? - - No - Patient declined - - -  Copy of [place] in Chart? - - Yes - - -  Would patient like information on creating a medical advance directive? - Yes (MAU/Ambulatory/Procedural Areas - Information given) - - - -    Tobacco Social History   Tobacco Use  Smoking Status Never Smoker  Smokeless Tobacco Never Used     Counseling given: Yes   Clinical Intake:  Pre-visit preparation completed: No  Pain : No/denies pain     BMI - recorded: 22.26 Nutritional Status: BMI of 19-24  Normal Nutritional Risks: None Diabetes: No  How often do you need to have someone help you when you read instructions, pamphlets, or other written materials from your doctor or pharmacy?: 5 - Always  Interpreter Needed?: No     Past Medical History:  Diagnosis Date  . Acute confusion   . Chicken pox   . Depression    Past Surgical History:  Procedure Laterality Date  . Unremarkable.     Family History  Problem Relation Age of Onset  . Diabetes Father        Deceased  . Cancer Father 64       Multiple Myeloma  . Heart defect Mother     Deceased  . Asthma Sister   . Heart defect Sister   . Heart defect Brother   . Migraines Other        Familial  . Heart attack Sister   . Healthy Brother        x4  . Healthy Sister        x2  . Alzheimer's disease Maternal Grandfather        Dementia  . Liver disease Maternal Uncle   . Healthy Son        x1  . Colon cancer Neg Hx   . Esophageal cancer Neg Hx   . Rectal cancer Neg Hx   . Stomach cancer Neg Hx    Social History   Socioeconomic History  . Marital status: Widowed    Spouse name: Not on file  . Number of children: Not on file  . Years of education: Not on file  . Highest education level: Not on file  Occupational History  . Not on file  Social Needs  . Financial resource strain: Not on file  . Food insecurity:    Worry: Not on file    Inability: Not on file  . Transportation needs:    Medical: Not on file  Non-medical: Not on file  Tobacco Use  . Smoking status: Never Smoker  . Smokeless tobacco: Never Used  Substance and Sexual Activity  . Alcohol use: No    Comment: rare  . Drug use: No  . Sexual activity: Never  Lifestyle  . Physical activity:    Days per week: Not on file    Minutes per session: Not on file  . Stress: Not on file  Relationships  . Social connections:    Talks on phone: Not on file    Gets together: Not on file    Attends religious service: Not on file    Active member of club or organization: Not on file    Attends meetings of clubs or organizations: Not on file    Relationship status: Not on file  Other Topics Concern  . Not on file  Social History Narrative  . Not on file    Outpatient Encounter Medications as of 02/23/2018  Medication Sig  . Cholecalciferol (VITAMIN D-1000 MAX ST) 1000 units tablet Take 1,000 Units by mouth daily.   Marland Kitchen donepezil (ARICEPT) 10 MG tablet Take 1 tablet (10 mg total) by mouth at bedtime.  Marland Kitchen escitalopram (LEXAPRO) 20 MG tablet Take 1 tablet (20 mg total) by mouth daily.  Marland Kitchen  gabapentin (NEURONTIN) 300 MG capsule Take 2 capsules (600 mg total) by mouth 3 (three) times daily.  Marland Kitchen HYDROcodone-acetaminophen (NORCO) 7.5-325 MG tablet Take 1 tablet by mouth every 6 (six) hours as needed for moderate pain.  . magnesium hydroxide (MILK OF MAGNESIA) 400 MG/5ML suspension Take 30 mLs by mouth daily as needed for mild constipation.  . meloxicam (MOBIC) 7.5 MG tablet   . meloxicam (MOBIC) 7.5 MG tablet TAKE 1 TABLET (7.5 MG TOTAL) BY MOUTH DAILY.  . memantine (NAMENDA XR) 28 MG CP24 24 hr capsule Take 1 capsule (28 mg total) by mouth daily.  . Multiple Vitamin (MULTIVITAMIN) tablet Take 1 tablet by mouth daily.  . multivitamin-lutein (OCUVITE-LUTEIN) CAPS capsule Take 1 capsule by mouth daily.  . simvastatin (ZOCOR) 20 MG tablet Take 1 tablet (20 mg total) by mouth at bedtime.  Marland Kitchen UNABLE TO FIND daily. Med Name: Resveratrol  . valACYclovir (VALTREX) 1000 MG tablet Take 1 tablet (1,000 mg total) by mouth 3 (three) times daily.   No facility-administered encounter medications on file as of 02/23/2018.     Activities of Daily Living In your present state of health, do you have any difficulty performing the following activities: 02/23/2018 04/22/2017  Hearing? N N  Vision? N N  Difficulty concentrating or making decisions? N Y  Walking or climbing stairs? N N  Dressing or bathing? N Y  Doing errands, shopping? N Y  Conservation officer, nature and eating ? Y Y  Using the Toilet? N N  In the past six months, have you accidently leaked urine? N N  Do you have problems with loss of bowel control? N N  Managing your Medications? Y Y  Managing your Finances? Tempie Donning  Housekeeping or managing your Housekeeping? Tempie Donning  Some recent data might be hidden   Patient Care Team: Delorse Limber as PCP - General (Family Medicine) Dene Gentry, MD as Consulting Physician (Sports Medicine) Volanda Napoleon, MD as Consulting Physician (Oncology) Pieter Partridge, DO as Consulting Physician  (Neurology) Loletha Carrow Kirke Corin, MD as Consulting Physician (Gastroenterology)    Assessment:   This is a routine wellness examination for Alexandria Oliver.  Exercise Activities and Dietary recommendations  Current Exercise Habits: The patient does not participate in regular exercise at present  Goals    . Increase physical activity     Increase activity.         Fall Risk Fall Risk  04/22/2017 01/26/2017  Falls in the past year? No Yes  Number falls in past yr: - 1   Is the patient's home free of loose throw rugs in walkways, pet beds, electrical cords, etc?   yes      Grab bars in the bathroom? yes      Handrails on the stairs?   yes      Adequate lighting?   yes  Timed Get Up and Go performed: Patient unable to understand commands for testing.  Depression Screen PHQ 2/9 Scores 04/22/2017 12/29/2016 09/26/2014  PHQ - 2 Score - - 0  Exception Documentation Medical reason Medical reason -  Not completed - Alzheimers/ealy dementia -     Cognitive Function MMSE - Mini Mental State Exam 07/27/2017 04/22/2017 01/26/2017  Not completed: Unable to complete Unable to complete -  Orientation to time - - 0  Orientation to Place - - 2  Registration - - 1  Attention/ Calculation - - 0  Recall - - 0  Language- name 2 objects - - 1  Language- repeat - - 0  Language- follow 3 step command - - 1  Language- read & follow direction - - 0  Write a sentence - - 0  Copy design - - 0  Total score - - 5   Immunization History  Administered Date(s) Administered  . Influenza, High Dose Seasonal PF 04/28/2016  . Influenza,inj,Quad PF,6+ Mos 04/22/2017  . Influenza-Unspecified 02/15/2014  . Tdap 04/28/2016   Qualifies for Shingles Vaccine? Due -- Will need to get at pharmacy due to insurance.  Screening Tests Health Maintenance  Topic Date Due  . PAP SMEAR  09/25/2017  . INFLUENZA VACCINE  12/16/2017  . MAMMOGRAM  06/24/2019  . DTaP/Tdap/Td (2 - Td) 04/28/2026  . TETANUS/TDAP  04/28/2026  .  COLONOSCOPY  08/04/2026  . Hepatitis C Screening  Completed  . HIV Screening  Completed    Cancer Screenings: Lung: Low Dose CT Chest recommended if Age 41-80 years, 30 pack-year currently smoking OR have quit w/in 15years. Patient does not qualify. Breast:  Up to date on Mammogram? Yes   Colorectal: up-to-date  Additional Screenings: Hepatitis C Screening - up-to-date PAP -- Due but they have decided to forego this due to advancing dementia.     Plan:  1. Early onset Alzheimer's dementia without behavioral disturbance ([place]) Followed by Neurology. Continue current regimen as directed by Dr. Tomi Likens. Will check TSH and CBC today. - CBC with Differential/Platelet - TSH  2. Depression, unspecified depression type Taking Lexapro and doing well. Continue current regimen. - TSH  3. Hyperlipidemia, unspecified hyperlipidemia type Taking medications as directed. Will check repeat labs today. - Comprehensive metabolic panel - TSH - Lipid panel  4. Medicare annual wellness visit, subsequent Depression screen negative. Health Maintenance reviewed. Preventive schedule discussed and handout given in AVS. Will obtain fasting labs today.   5. Need for immunization against influenza Flu shot given today by CMA. - Flu Vaccine QUAD 6+ mos IM (Fluarix)   I have personally reviewed and noted the following in the patient's chart:   . Medical and social history . Use of alcohol, tobacco or illicit drugs  . Current medications and supplements . Functional ability and status . Nutritional status .  Physical activity . Advanced directives . List of other physicians . Hospitalizations, surgeries, and ER visits in previous 12 months . Vitals . Screenings to include cognitive, depression, and falls . Referrals and appointments  In addition, I have reviewed and discussed with patient certain preventive protocols, quality metrics, and best practice recommendations. A written personalized  care plan for preventive services as well as general preventive health recommendations were provided to patient.     Leeanne Rio, PA-C  02/23/2018

## 2018-03-01 ENCOUNTER — Other Ambulatory Visit: Payer: Self-pay | Admitting: Physician Assistant
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
## 2018-03-02 ENCOUNTER — Ambulatory Visit (INDEPENDENT_AMBULATORY_CARE_PROVIDER_SITE_OTHER): Payer: Medicare Other | Admitting: Neurology

## 2018-03-02 ENCOUNTER — Encounter: Payer: Self-pay | Admitting: Neurology

## 2018-03-02 VITALS — BP 106/74 | HR 72 | Ht 60.0 in | Wt 114.0 lb

## 2018-03-02 DIAGNOSIS — F0280 Dementia in other diseases classified elsewhere without behavioral disturbance: Secondary | ICD-10-CM

## 2018-03-02 DIAGNOSIS — G300 Alzheimer's disease with early onset: Secondary | ICD-10-CM | POA: Diagnosis not present

## 2018-03-02 NOTE — Patient Instructions (Addendum)
1.  Continue donepezil 10mg  and Namenda XR 28mg  daily 2.  Continue Lexapro 3.  Follow up in 6 months   Alzheimer Disease Caregiver Guide A person who has Alzheimer disease may not be able to take care of himself or herself. He or she may need help with simple tasks. The tips below can help you care for the person. Memory loss and confusion If the person is confused or cannot remember things:  Stay calm.  Respond with a short answer.  Avoid correcting him or her in a way that sounds like scolding.  Try not to take it personally, even if he or she forgets your name.  Behavior changes The person may go through behavior changes. This can include depression, anxiety, anger, or seeing things that are not there. When behavior changes:  Try not to take behavior changes personally.  Stay calm and patient.  Do not argue or try to convince the person about a specific point.  Know that these changes are part of the disease process. Try to work through it.  Tips to lessen frustration  Make appointments and do daily tasks when the person is at his or her best.  Take your time. Simple tasks may take longer. Allow plenty of time to complete tasks.  Limit choices for the person.  Involve the person in what you are doing.  Stick to a routine.  Avoid new or crowded places, if possible.  Use simple words, short sentences, and a calm voice. Only give 1 direction at a time.  Buy clothes and shoes that are easy to put on and take off.  Let people help if they offer. Home safety  Keep floors clear. Remove rugs, magazine racks, and floor lamps.  Keep hallways well lit.  Put a handrail and nonslip mat in the bathtub or shower.  Put childproof locks on cabinets that have dangerous items in them. These items include medicine, alcohol, guns, toxic cleaning items, sharp tools, matches, or lighters.  Place locks on doors where the person cannot see or reach them. This helps the person to  not wander out of the house and get lost.  Be prepared for emergencies. Keep a list of emergency phone numbers and addresses in a handy area. Plans for the future  Talk about finances. ? Talk about money management. People with Alzheimer disease have trouble managing their money as the disease gets worse. ? Get help from professional advisors about financial and legal matters.  Talk about future care. ? Choose a power of attorney. This is someone who can make decisions for the person with Alzheimer disease when he or she can no longer do so. ? Talk about driving and when it is the right time to stop. The person's doctor can help with this. ? If the person lives alone, make sure he or she is safe. Some people need extra help at home. Other people need more care at a nursing home or care center. Support groups Some benefits of joining a support group include:  Learning ways to manage stress.  Sharing experiences with others.  Getting emotional comfort and support.  Learning new caregiving skills as the disease progresses.  Knowing what community resources are available and taking advantage of them.  Get help if:  The person has a fever.  The person has a sudden behavior change that does not get better with calming strategies.  The person is unable to manage his or her living situation.  The person threatens  you or anyone else, including himself or herself.  You are no longer able to care for the person. This information is not intended to replace advice given to you by your health care provider. Make sure you discuss any questions you have with your health care provider. Document Released: 07/27/2011 Document Revised: 10/10/2015 Document Reviewed: 06/24/2011 Elsevier Interactive Patient Education  2017 [employer].

## 2018-03-06 ENCOUNTER — Other Ambulatory Visit: Payer: Self-pay | Admitting: Physician Assistant

## 2018-03-24 MED FILL — MELOXICAM 7.5 MG TABLET: 7.5 | 30 days supply | Qty: 30 | Fill #3

## 2018-05-04 MED FILL — MELOXICAM 7.5 MG TABLET: 7.5 | 30 days supply | Qty: 30 | Fill #4

## 2018-07-22 ENCOUNTER — Other Ambulatory Visit: Payer: Self-pay | Admitting: Neurology

## 2018-08-16 ENCOUNTER — Other Ambulatory Visit: Payer: Self-pay | Admitting: Neurology

## 2018-09-15 ENCOUNTER — Encounter: Payer: Self-pay | Admitting: Neurology

## 2018-09-20 ENCOUNTER — Ambulatory Visit: Payer: Medicare Other | Admitting: Neurology

## 2018-10-20 ENCOUNTER — Other Ambulatory Visit: Payer: Self-pay | Admitting: Neurology

## 2018-11-26 ENCOUNTER — Other Ambulatory Visit: Payer: Self-pay | Admitting: Physician Assistant

## 2019-02-21 ENCOUNTER — Ambulatory Visit: Payer: Medicare Other | Admitting: Neurology

## 2019-02-22 ENCOUNTER — Ambulatory Visit: Payer: Medicare Other | Admitting: Physician Assistant

## 2019-02-23 ENCOUNTER — Telehealth: Payer: Self-pay | Admitting: *Deleted

## 2019-02-23 ENCOUNTER — Other Ambulatory Visit: Payer: Self-pay

## 2019-02-23 ENCOUNTER — Encounter: Payer: Self-pay | Admitting: Neurology

## 2019-02-23 ENCOUNTER — Ambulatory Visit (INDEPENDENT_AMBULATORY_CARE_PROVIDER_SITE_OTHER): Payer: Medicare Other | Admitting: Neurology

## 2019-02-23 VITALS — BP 90/60 | HR 69 | Temp 98.0°F | Ht 60.0 in | Wt 99.0 lb

## 2019-02-23 DIAGNOSIS — G300 Alzheimer's disease with early onset: Secondary | ICD-10-CM

## 2019-02-23 DIAGNOSIS — R6889 Other general symptoms and signs: Secondary | ICD-10-CM

## 2019-02-23 DIAGNOSIS — F0280 Dementia in other diseases classified elsewhere without behavioral disturbance: Secondary | ICD-10-CM

## 2019-02-23 MED ORDER — ESCITALOPRAM OXALATE 20 MG PO TABS: 20.0000 mg | ORAL_TABLET | Freq: Every day | ORAL | 3 refills | Status: DC

## 2019-02-23 MED ORDER — MEMANTINE HCL ER 28 MG PO CP24
28.0000 mg | ORAL_CAPSULE | Freq: Every day | ORAL | 3 refills | Status: AC
Start: 2019-02-23 — End: ?

## 2019-02-23 MED ORDER — DONEPEZIL HCL 10 MG PO TABS: 10.0000 mg | ORAL_TABLET | Freq: Every day | ORAL | 3 refills | Status: DC

## 2019-02-23 MED ORDER — GABAPENTIN 300 MG PO CAPS: 600.0000 mg | ORAL_CAPSULE | Freq: Three times a day (TID) | ORAL | 3 refills | Status: AC

## 2019-02-23 NOTE — Patient Instructions (Addendum)
I refilled:  Gabapentin 600mg  three times daily  Escitalopram 20mg  daily  Donepezil 10mg  at daily  memantine XR 28mg  daily  RESOURCES: Development worker, community of [facility]: [phone]  Tel [place]:  [phone]  www.[facility].org/resources.cfm   Resources for common questions found under "Pathways & Protocols "  www.[facility].org/pathways/Pathways_Menu.htm   Resources for  Nursing Homes and Assisted Living facilities:  www.ncnursinghomeguide.com     Follow up in one year.

## 2019-02-23 NOTE — Telephone Encounter (Signed)
Duplication - see prior telephone note.

## 2019-02-23 NOTE — Telephone Encounter (Addendum)
Spoke with brother Timmothy Sours who is with patient for appt and he is interested in learning more about palliative care.   Call placed to [facility] Referral placed in Epic for ambulatory referral to palliative care.  Brother Bobette Mo   [phone]) called to inform him that they will be calling him. He was not there will try again tomorrow and inform him that referral was placed.    Update - called Reynaldo and informed him consult placed - he was appreciative. [facility] referral coordiator Marzetta Board will call him.
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
## 2019-02-27 ENCOUNTER — Telehealth: Payer: Self-pay | Admitting: Licensed Clinical Social Worker

## 2019-02-27 NOTE — Telephone Encounter (Signed)
Palliative Care SW left a VM with patient to schedule a visit.

## 2019-02-28 ENCOUNTER — Ambulatory Visit: Payer: Medicare Other | Admitting: Physician Assistant

## 2019-03-01 ENCOUNTER — Ambulatory Visit (INDEPENDENT_AMBULATORY_CARE_PROVIDER_SITE_OTHER): Payer: Medicare Other | Admitting: Physician Assistant

## 2019-03-01 ENCOUNTER — Other Ambulatory Visit: Payer: Self-pay

## 2019-03-01 ENCOUNTER — Encounter: Payer: Self-pay | Admitting: Physician Assistant

## 2019-03-01 VITALS — BP 110/78 | HR 67 | Temp 97.7°F | Resp 14 | Ht 60.0 in | Wt 100.0 lb

## 2019-03-01 DIAGNOSIS — Z23 Encounter for immunization: Secondary | ICD-10-CM

## 2019-03-01 DIAGNOSIS — F0280 Dementia in other diseases classified elsewhere without behavioral disturbance: Secondary | ICD-10-CM

## 2019-03-01 DIAGNOSIS — Z0000 Encounter for general adult medical examination without abnormal findings: Secondary | ICD-10-CM | POA: Diagnosis not present

## 2019-03-01 DIAGNOSIS — G300 Alzheimer's disease with early onset: Secondary | ICD-10-CM

## 2019-03-01 NOTE — Progress Notes (Signed)
Subjective:   Alexandria Oliver is a 63 y.o. female who presents for Medicare Annual (Subsequent) preventive examination. Patient accompanied by her brother (primary caregiver)  Patient with history of early onset Alzheimer's dementia, followed by Neurology Tomi Likens) and on a regimen of Namenda and Aricept. Has had a decline over the past few months, now being almost a total care for ADLs. Brother and his wife are taking care of her 24/7 Had follow-up with Neurology last week -- they are working with social work and have discussed palliative care/hospice.   Review of Systems:  Review of Systems  Unable to perform ROS: Dementia   Objective:     Vitals: Resp 14   Ht 5' (1.524 m)   Wt 100 lb (45.4 kg)   BMI 19.53 kg/m   Body mass index is 19.53 kg/m.  Advanced Directives 02/23/2019 02/23/2018 04/22/2017 03/02/2017 01/29/2017 08/03/2016 11/13/2013  Does Patient Have a Medical Advance Directive? No No No Yes No - Patient would not like information  Type of Advance Directive - - - Event organiser -  Does patient want to make changes to medical advance directive? - - - No - Patient declined - - -  Copy of [place] in Chart? - - - Yes - - -  Would patient like information on creating a medical advance directive? No - Patient declined - Yes (MAU/Ambulatory/Procedural Areas - Information given) - - - -    Tobacco Social History   Tobacco Use  Smoking Status Never Smoker  Smokeless Tobacco Never Used     Counseling given: Yes   Clinical Intake:     Pain : No/denies pain     BMI - recorded: 19 Nutritional Status: BMI of 19-24  Normal Nutritional Risks: Unintentional weight loss, Failure to thrive Diabetes: No           Past Medical History:  Diagnosis Date  . Acute confusion   . Chicken pox   . Depression    Past Surgical History:  Procedure Laterality Date  . Unremarkable.     Family History  Problem  Relation Age of Onset  . Diabetes Father        Deceased  . Cancer Father 47       Multiple Myeloma  . Heart defect Mother        Deceased  . Asthma Sister   . Heart defect Sister   . Heart defect Brother   . Migraines Other        Familial  . Heart attack Sister   . Healthy Brother        x4  . Healthy Sister        x2  . Alzheimer's disease Maternal Grandfather        Dementia  . Liver disease Maternal Uncle   . Healthy Son        x1  . Colon cancer Neg Hx   . Esophageal cancer Neg Hx   . Rectal cancer Neg Hx   . Stomach cancer Neg Hx    Social History   Socioeconomic History  . Marital status: Widowed    Spouse name: Not on file  . Number of children: Not on file  . Years of education: Not on file  . Highest education level: Not on file  Occupational History  . Not on file  Social Needs  . Financial resource strain: Not on file  . Food insecurity  Worry: Not on file    Inability: Not on file  . Transportation needs    Medical: Not on file    Non-medical: Not on file  Tobacco Use  . Smoking status: Never Smoker  . Smokeless tobacco: Never Used  Substance and Sexual Activity  . Alcohol use: No    Comment: rare  . Drug use: No  . Sexual activity: Never  Lifestyle  . Physical activity    Days per week: Not on file    Minutes per session: Not on file  . Stress: Not on file  Relationships  . Social Herbalist on phone: Not on file    Gets together: Not on file    Attends religious service: Not on file    Active member of club or organization: Not on file    Attends meetings of clubs or organizations: Not on file    Relationship status: Not on file  Other Topics Concern  . Not on file  Social History Narrative   Caffeine - coffee 3-4 cups/week      Mostly drinks       Right handed      Lives with brother/family    Outpatient Encounter Medications as of 03/01/2019  Medication Sig  . Cholecalciferol (VITAMIN D-1000 MAX ST) 1000  units tablet Take 1,000 Units by mouth daily.   Marland Kitchen donepezil (ARICEPT) 10 MG tablet Take 1 tablet (10 mg total) by mouth at bedtime.  Marland Kitchen escitalopram (LEXAPRO) 20 MG tablet Take 1 tablet (20 mg total) by mouth daily.  Marland Kitchen gabapentin (NEURONTIN) 300 MG capsule Take 2 capsules (600 mg total) by mouth 3 (three) times daily.  . magnesium hydroxide (MILK OF MAGNESIA) 400 MG/5ML suspension Take 30 mLs by mouth daily as needed for mild constipation.  . memantine (NAMENDA XR) 28 MG CP24 24 hr capsule Take 1 capsule (28 mg total) by mouth daily.  . Multiple Vitamin (MULTIVITAMIN) tablet Take 1 tablet by mouth daily.  . multivitamin-lutein (OCUVITE-LUTEIN) CAPS capsule Take 1 capsule by mouth daily.  . simvastatin (ZOCOR) 20 MG tablet TAKE 1 TABLET AT BEDTIME  . UNABLE TO FIND daily. Med Name: Resveratrol  . [DISCONTINUED] HYDROcodone-acetaminophen (NORCO) 7.5-325 MG tablet Take 1 tablet by mouth every 6 (six) hours as needed for moderate pain. (Patient not taking: Reported on 02/23/2019)  . [DISCONTINUED] meloxicam (MOBIC) 7.5 MG tablet TAKE 1 TABLET (7.5 MG TOTAL) BY MOUTH DAILY. (Patient not taking: Reported on 02/23/2019)  . [DISCONTINUED] valACYclovir (VALTREX) 1000 MG tablet Take 1 tablet (1,000 mg total) by mouth 3 (three) times daily. (Patient not taking: Reported on 02/23/2019)   No facility-administered encounter medications on file as of 03/01/2019.     Activities of Daily Living In your present state of health, do you have any difficulty performing the following activities: 03/01/2019  Hearing? N  Vision? N  Difficulty concentrating or making decisions? Y  Walking or climbing stairs? Y  Dressing or bathing? Y  Doing errands, shopping? Y  Some recent data might be hidden    Patient Care Team: Delorse Limber as PCP - General (Family Medicine) Dene Gentry, MD as Consulting Physician (Sports Medicine) Volanda Napoleon, MD as Consulting Physician (Oncology) Pieter Partridge, DO as  Consulting Physician (Neurology) Loletha Carrow Kirke Corin, MD as Consulting Physician (Gastroenterology)    Assessment:   This is a routine wellness examination for Nelly.  Exercise Activities and Dietary recommendations    Goals    .  Increase physical activity     Increase activity.         Fall Risk Fall Risk  03/01/2019 02/23/2019 04/22/2017 01/26/2017  Falls in the past year? 1 1 No Yes  Number falls in past yr: 1 1 - 1  Injury with Fall? 0 1 - -  Comment - shoulder - -  Risk for fall due to : History of fall(s);Impaired balance/gait;Mental status change - - -  Follow up Falls evaluation completed - - -   Is the patient's home free of loose throw rugs in walkways, pet beds, electrical cords, etc?   yes      Grab bars in the bathroom? yes      Handrails on the stairs?   yes      Adequate lighting?   yes  Depression Screen PHQ 2/9 Scores 04/22/2017 12/29/2016 09/26/2014  PHQ - 2 Score - - 0  Exception Documentation Medical reason Medical reason -  Not completed - Alzheimers/ealy dementia -     Cognitive Function MMSE - Mini Mental State Exam 02/23/2019 03/02/2018 07/27/2017 04/22/2017 01/26/2017  Not completed: Unable to complete Unable to complete Unable to complete Unable to complete -  Orientation to time - - - - 0  Orientation to Place - - - - 2  Registration - - - - 1  Attention/ Calculation - - - - 0  Recall - - - - 0  Language- name 2 objects - - - - 1  Language- repeat - - - - 0  Language- follow 3 step command - - - - 1  Language- read & follow direction - - - - 0  Write a sentence - - - - 0  Copy design - - - - 0  Total score - - - - 5        Immunization History  Administered Date(s) Administered  . Influenza, High Dose Seasonal PF 04/28/2016  . Influenza,inj,Quad PF,6+ Mos 04/22/2017, 02/23/2018  . Influenza-Unspecified 02/15/2014  . Tdap 04/28/2016   Screening Tests Health Maintenance  Topic Date Due  . INFLUENZA VACCINE  12/17/2018  . MAMMOGRAM   06/24/2019  . DTaP/Tdap/Td (2 - Td) 04/28/2026  . TETANUS/TDAP  04/28/2026  . COLONOSCOPY  08/04/2026  . Hepatitis C Screening  Completed  . HIV Screening  Completed    Cancer Screenings: Lung: Low Dose CT Chest recommended if Age 61-80 years, 30 pack-year currently smoking OR have quit w/in 15years. Patient does not qualify. Breast:  Up to date on Mammogram? Yes   Colorectal: UTD  Additional Screenings: Hepatitis C Screening: Completed     Plan:    1. Early onset Alzheimer's dementia without behavioral disturbance ([place]) Getting close to end-stage. Awaiting palliative consult. Will have them continue care by Neurology. D/C simvastatin.  - CBC w/Diff - Comp Met (CMET)  2. Encounter for Medicare annual wellness exam During the course of the visit the patient was educated and counseled about appropriate screening and preventive services including: Fall prevention, Bone densitometry screening, Diabetes screening, Nutrition counseling.  Patient UTD on required immunizations.. Will obtain fasting labs at today's visit.   Patient Instructions (the written plan) was given to the patient.    3. Need for immunization against influenza= - Flu Vaccine QUAD 36+ mos IM    I have personally reviewed and noted the following in the patient's chart:   . Medical and social history . Use of alcohol, tobacco or illicit drugs  . Current medications and supplements . Functional  ability and status . Nutritional status . Physical activity . Advanced directives . List of other physicians . Hospitalizations, surgeries, and ER visits in previous 12 months . Vitals . Screenings to include cognitive, depression, and falls . Referrals and appointments  In addition, I have reviewed and discussed with patient certain preventive protocols, quality metrics, and best practice recommendations. A written personalized care plan for preventive services as well as general preventive health recommendations  were provided to patient.     Leeanne Rio, PA-C  03/01/2019

## 2019-03-01 NOTE — Patient Instructions (Signed)
Please go to the lab today for blood work.  I will call you with your results. We will alter treatment regimen(s) if indicated by your results.   We have stopped the cholesterol medication as there is no indication to continue this.  Follow-up with Dr. Tomi Likens, the social worker and the palliative care team.  Please let us know if we can assist you guys with any transitions of care.

## 2019-03-02 LAB — CBC WITH DIFFERENTIAL/PLATELET
Basophils Absolute: 0.1 10*3/uL (ref 0.0–0.1)
Basophils Relative: 1.4 % (ref 0.0–3.0)
Eosinophils Absolute: 0.1 10*3/uL (ref 0.0–0.7)
Eosinophils Relative: 2.4 % (ref 0.0–5.0)
HCT: 37.6 % (ref 36.0–46.0)
Hemoglobin: 12.5 g/dL (ref 12.0–15.0)
Lymphocytes Relative: 40 % (ref 12.0–46.0)
Lymphs Abs: 2 10*3/uL (ref 0.7–4.0)
MCHC: 33.4 g/dL (ref 30.0–36.0)
MCV: 95.3 fl (ref 78.0–100.0)
Monocytes Absolute: 0.4 10*3/uL (ref 0.1–1.0)
Monocytes Relative: 7.8 % (ref 3.0–12.0)
Neutro Abs: 2.4 10*3/uL (ref 1.4–7.7)
Neutrophils Relative %: 48.4 % (ref 43.0–77.0)
Platelets: 206 10*3/uL (ref 150.0–400.0)
RBC: 3.95 Mil/uL (ref 3.87–5.11)
RDW: 12.3 % (ref 11.5–15.5)
WBC: 5 10*3/uL (ref 4.0–10.5)

## 2019-03-02 LAB — COMPREHENSIVE METABOLIC PANEL
ALT: 60 U/L — ABNORMAL HIGH (ref 0–35)
AST: 33 U/L (ref 0–37)
Albumin: 4.5 g/dL (ref 3.5–5.2)
Alkaline Phosphatase: 60 U/L (ref 39–117)
BUN: 20 mg/dL (ref 6–23)
CO2: 33 mEq/L — ABNORMAL HIGH (ref 19–32)
Calcium: 9.6 mg/dL (ref 8.4–10.5)
Chloride: 101 mEq/L (ref 96–112)
Creatinine, Ser: 0.7 mg/dL (ref 0.40–1.20)
GFR: 84.54 mL/min (ref >60.00)
Glucose, Bld: 90 mg/dL (ref 70–99)
Potassium: 4.2 mEq/L (ref 3.5–5.1)
Sodium: 138 mEq/L (ref 135–145)
Total Bilirubin: 0.4 mg/dL (ref 0.2–1.2)
Total Protein: 7.3 g/dL (ref 6.0–8.3)

## 2019-03-03 ENCOUNTER — Other Ambulatory Visit: Payer: Self-pay | Admitting: *Deleted

## 2019-03-03 DIAGNOSIS — E785 Hyperlipidemia, unspecified: Secondary | ICD-10-CM

## 2019-03-06 ENCOUNTER — Telehealth: Payer: Self-pay | Admitting: Licensed Clinical Social Worker

## 2019-03-06 NOTE — Telephone Encounter (Signed)
Palliative Care SW left a vm with patient's brother, Reynald, to schedule a visit.

## 2019-03-08 ENCOUNTER — Emergency Department (HOSPITAL_COMMUNITY): Payer: Medicare Other

## 2019-03-08 ENCOUNTER — Other Ambulatory Visit: Payer: Self-pay

## 2019-03-08 ENCOUNTER — Emergency Department (HOSPITAL_COMMUNITY)
Admission: EM | Admit: 2019-03-08 | Discharge: 2019-03-08 | Disposition: A | Discharge status: Home / Self Care | Payer: Medicare Other | Attending: Emergency Medicine | Admitting: Emergency Medicine

## 2019-03-08 DIAGNOSIS — R0902 Hypoxemia: Secondary | ICD-10-CM | POA: Diagnosis not present

## 2019-03-08 DIAGNOSIS — I10 Essential (primary) hypertension: Secondary | ICD-10-CM | POA: Diagnosis not present

## 2019-03-08 DIAGNOSIS — G300 Alzheimer's disease with early onset: Secondary | ICD-10-CM | POA: Insufficient documentation

## 2019-03-08 DIAGNOSIS — G40901 Epilepsy, unspecified, not intractable, with status epilepticus: Secondary | ICD-10-CM | POA: Diagnosis not present

## 2019-03-08 DIAGNOSIS — R05 Cough: Secondary | ICD-10-CM | POA: Diagnosis not present

## 2019-03-08 DIAGNOSIS — R4182 Altered mental status, unspecified: Secondary | ICD-10-CM | POA: Diagnosis not present

## 2019-03-08 DIAGNOSIS — R404 Transient alteration of awareness: Secondary | ICD-10-CM | POA: Diagnosis not present

## 2019-03-08 DIAGNOSIS — R569 Unspecified convulsions: Secondary | ICD-10-CM | POA: Diagnosis not present

## 2019-03-08 LAB — COMPREHENSIVE METABOLIC PANEL
ALT: 56 U/L — ABNORMAL HIGH (ref 0–44)
AST: 46 U/L — ABNORMAL HIGH (ref 15–41)
Albumin: 3.9 g/dL (ref 3.5–5.0)
Alkaline Phosphatase: 49 U/L (ref 38–126)
Anion gap: 10 (ref 5–15)
BUN: 28 mg/dL — ABNORMAL HIGH (ref 8–23)
CO2: 26 mmol/L (ref 22–32)
Calcium: 9.9 mg/dL (ref 8.9–10.3)
Chloride: 108 mmol/L (ref 98–111)
Creatinine, Ser: 0.86 mg/dL (ref 0.44–1.00)
GFR calc Af Amer: >60 mL/min (ref >60)
GFR calc non Af Amer: >60 mL/min (ref >60)
Glucose, Bld: 109 mg/dL — ABNORMAL HIGH (ref 70–99)
Potassium: 4 mmol/L (ref 3.5–5.1)
Sodium: 144 mmol/L (ref 135–145)
Total Bilirubin: 0.6 mg/dL (ref 0.3–1.2)
Total Protein: 6.8 g/dL (ref 6.5–8.1)

## 2019-03-08 LAB — CBC WITH DIFFERENTIAL/PLATELET
Abs Immature Granulocytes: 0.01 10*3/uL (ref 0.00–0.07)
Basophils Absolute: 0 10*3/uL (ref 0.0–0.1)
Basophils Relative: 1 %
Eosinophils Absolute: 0.1 10*3/uL (ref 0.0–0.5)
Eosinophils Relative: 2 %
HCT: 38.4 % (ref 36.0–46.0)
Hemoglobin: 12.5 g/dL (ref 12.0–15.0)
Immature Granulocytes: 0 %
Lymphocytes Relative: 32 %
Lymphs Abs: 1.2 10*3/uL (ref 0.7–4.0)
MCH: 32.1 pg (ref 26.0–34.0)
MCHC: 32.6 g/dL (ref 30.0–36.0)
MCV: 98.7 fL (ref 80.0–100.0)
Monocytes Absolute: 0.3 10*3/uL (ref 0.1–1.0)
Monocytes Relative: 7 %
Neutro Abs: 2.3 10*3/uL (ref 1.7–7.7)
Neutrophils Relative %: 58 %
Platelets: 177 10*3/uL (ref 150–400)
RBC: 3.89 MIL/uL (ref 3.87–5.11)
RDW: 11.5 % (ref 11.5–15.5)
WBC: 3.9 10*3/uL — ABNORMAL LOW (ref 4.0–10.5)
nRBC: 0 % (ref 0.0–0.2)

## 2019-03-08 LAB — MAGNESIUM: Magnesium: 1.7 mg/dL (ref 1.7–2.4)

## 2019-03-08 LAB — URINALYSIS, ROUTINE W REFLEX MICROSCOPIC
Bilirubin Urine: NEGATIVE
Glucose, UA: NEGATIVE mg/dL
Hgb urine dipstick: NEGATIVE
Ketones, ur: NEGATIVE mg/dL
Leukocytes,Ua: NEGATIVE
Nitrite: NEGATIVE
Protein, ur: 30 mg/dL — AB
Specific Gravity, Urine: 1.024 (ref 1.005–1.030)
pH: 5 (ref 5.0–8.0)

## 2019-03-08 LAB — CBG MONITORING, ED: Glucose-Capillary: 101 mg/dL — ABNORMAL HIGH (ref 70–99)

## 2019-03-08 MED ORDER — LEVETIRACETAM IN NACL 1000 MG/100ML IV SOLN
1000.0000 mg | Freq: Once | INTRAVENOUS | Status: AC
  Administered 2019-03-08: 1000 mg via INTRAVENOUS
  Filled 2019-03-08: qty 100

## 2019-03-08 MED ORDER — LEVETIRACETAM 500 MG PO TABS: 500.0000 mg | ORAL_TABLET | Freq: Two times a day (BID) | ORAL | 0 refills | Status: DC

## 2019-03-08 MED FILL — levETIRAcetam 500 MG TABS: 500 | 30 days supply | Qty: 60 | Fill #0

## 2019-03-08 NOTE — ED Notes (Addendum)
Pt's brother came to bedside and pt is reponsive to brother's Filipino dialect. Per brother she is fluent in [place] but often she is less responsive to [place].   Per brother he heard a loud thump this morning and went in to find his mom on the floor. Per brother he got her back into bed and went to make coffee. When he arrived back to the room he found her having a seizure. He states this lasted a "minute or two" so he called ems. On ems arrival pt was not responding to son or ems. No history of seizures. Pt is now opening her eyes to her brother and did answer in filipino what her name was and her brother's name. She then closes her eyes again, not following any commands.

## 2019-03-08 NOTE — ED Notes (Addendum)
Pt placed on cardiac monitor and into a gown. Pt has non labored respirations appears comfortable in no distress.

## 2019-03-08 NOTE — ED Provider Notes (Signed)
[facility] EMERGENCY DEPARTMENT Provider Note   CSN: [id number] Arrival date & time: 03/08/19  0355     History   Chief Complaint Chief Complaint  Patient presents with  . Seizures    HPI Kanasia Gayman is a 63 y.o. female.     63 yo F with a cc of seizure like activity.  Patient was found to be in the room.  Family stated that they heard a thump this morning and found her on the ground.  No obvious injuries.  She went to the bathroom went back and then laid back in the bed.  He then went back into her room and noticed that she was shaking all over.  Lasted for about a minute and 1/2 to 2 minutes.  After which she was very sleepy.  He called 911 as he was unable to arouse her.  By the time the ambulance had arrived the patient was able to answer questions appropriately but seemed slightly more confused than her baseline.  The patient unfortunately has advanced Alzheimer's.  She has been seen by neurology for this has had recent brain imaging and lab work.  The next plan was to have her evaluated by palliative care as they thought this is unlikely to recover.  Family denies any recent infection no cough no fever no vomiting no diarrhea no urinary symptoms no abdominal pain.  He also felt it was unlikely for her to be drinking alcohol or doing illegal drugs as there is none in the house and the patient is unable to travel alone with her advanced dementia  The history is provided by the patient.  Seizures Seizure activity on arrival: no   Seizure type:  Grand mal Initial focality:  None Episode characteristics: abnormal movements and generalized shaking   Postictal symptoms: somnolence   Return to baseline: no   Severity:  Moderate Duration:  2 minutes Timing:  Once Number of seizures this episode:  1 Progression:  Worsening Recent head injury:  Immediately preceding the event PTA treatment:  None History of seizures: no     Past Medical History:   Diagnosis Date  . Acute confusion   . Chicken pox   . Depression     Patient Active Problem List   Diagnosis Date Noted  . Low back pain radiating to left leg 04/04/2017  . MGUS (monoclonal gammopathy of unknown significance) 03/23/2017  . Hyperlipidemia 10/29/2015  . Colon cancer screening 10/29/2015  . Visit for preventive health examination 09/26/2014  . Breast cancer screening 09/26/2014  . Pap smear for cervical cancer screening 09/26/2014  . Alzheimer's disease, early onset ([place]) 05/28/2014  . Depression 11/14/2013    Past Surgical History:  Procedure Laterality Date  . Unremarkable.       OB History   No obstetric history on file.      Home Medications    Prior to Admission medications   Medication Sig Start Date End Date Taking? Authorizing Provider  cetirizine (ZYRTEC) 10 MG tablet Take 10 mg by mouth daily as needed for allergies.   Yes [provider]  diphenhydrAMINE (BENADRYL) 25 MG tablet Take 25 mg by mouth every 6 (six) hours as needed for allergies.   Yes [provider]  donepezil (ARICEPT) 10 MG tablet Take 1 tablet (10 mg total) by mouth at bedtime. 02/23/19  Yes Jaffe, Adam R, DO  escitalopram (LEXAPRO) 20 MG tablet Take 1 tablet (20 mg total) by mouth daily. 02/23/19  Yes Jaffe,  Adam R, DO  gabapentin (NEURONTIN) 300 MG capsule Take 2 capsules (600 mg total) by mouth 3 (three) times daily. 02/23/19  Yes Jaffe, Adam R, DO  magnesium hydroxide (MILK OF MAGNESIA) 400 MG/5ML suspension Take 30 mLs by mouth daily as needed for mild constipation.   Yes [provider]  memantine (NAMENDA XR) 28 MG CP24 24 hr capsule Take 1 capsule (28 mg total) by mouth daily. 02/23/19  Yes Jaffe, Adam R, DO  levETIRAcetam (KEPPRA) 500 MG tablet Take 1 tablet (500 mg total) by mouth 2 (two) times daily. 03/08/19   Deno Etienne, DO    Family History Family History  Problem Relation Age of Onset  . Diabetes Father        Deceased  . Cancer Father 80        Multiple Myeloma  . Heart defect Mother        Deceased  . Asthma Sister   . Heart defect Sister   . Heart defect Brother   . Migraines Other        Familial  . Heart attack Sister   . Healthy Brother        x4  . Healthy Sister        x2  . Alzheimer's disease Maternal Grandfather        Dementia  . Liver disease Maternal Uncle   . Healthy Son        x1  . Colon cancer Neg Hx   . Esophageal cancer Neg Hx   . Rectal cancer Neg Hx   . Stomach cancer Neg Hx     Social History Social History   Tobacco Use  . Smoking status: Never Smoker  . Smokeless tobacco: Never Used  Substance Use Topics  . Alcohol use: No    Comment: rare  . Drug use: No     Allergies   Patient has no known allergies.   Review of Systems Review of Systems  Constitutional: Negative for chills and fever.  HENT: Negative for congestion and rhinorrhea.   Eyes: Negative for redness and visual disturbance.  Respiratory: Negative for shortness of breath and wheezing.   Cardiovascular: Negative for chest pain and palpitations.  Gastrointestinal: Negative for nausea and vomiting.  Genitourinary: Negative for dysuria and urgency.  Musculoskeletal: Negative for arthralgias and myalgias.  Skin: Negative for pallor and wound.  Neurological: Positive for seizures. Negative for dizziness and headaches.     Physical Exam Updated Vital Signs BP 94/72   Pulse (!) 59   Resp 13   SpO2 97%   Physical Exam Vitals signs and nursing note reviewed.  Constitutional:      General: She is not in acute distress.    Appearance: She is well-developed. She is not diaphoretic.  HENT:     Head: Normocephalic and atraumatic.  Eyes:     Pupils: Pupils are equal, round, and reactive to light.  Neck:     Musculoskeletal: Normal range of motion and neck supple.  Cardiovascular:     Rate and Rhythm: Normal rate and regular rhythm.     Heart sounds: No murmur. No friction rub. No gallop.   Pulmonary:      Effort: Pulmonary effort is normal.     Breath sounds: No wheezing or rales.  Abdominal:     General: There is no distension.     Palpations: Abdomen is soft.     Tenderness: There is no abdominal tenderness.  Musculoskeletal:  General: No tenderness.  Skin:    General: Skin is warm and dry.  Neurological:     Mental Status: She is alert.     Comments: Opens her eyes to verbal stimuli.       ED Treatments / Results  Labs (all labs ordered are listed, but only abnormal results are displayed) Labs Reviewed  CBC WITH DIFFERENTIAL/PLATELET - Abnormal; Notable for the following components:      Result Value   WBC 3.9 (*)    All other components within normal limits  COMPREHENSIVE METABOLIC PANEL - Abnormal; Notable for the following components:   Glucose, Bld 109 (*)    BUN 28 (*)    AST 46 (*)    ALT 56 (*)    All other components within normal limits  URINALYSIS, ROUTINE W REFLEX MICROSCOPIC - Abnormal; Notable for the following components:   Protein, ur 30 (*)    Bacteria, UA FEW (*)    All other components within normal limits  CBG MONITORING, ED - Abnormal; Notable for the following components:   Glucose-Capillary 101 (*)    All other components within normal limits  MAGNESIUM    EKG EKG Interpretation  Date/Time:  Wednesday March 08 2019 07:13:40 EDT Ventricular Rate:  59 PR Interval:    QRS Duration: 87 QT Interval:  440 QTC Calculation: 436 R Axis:   56 Text Interpretation:  Sinus rhythm Low voltage, precordial leads Anteroseptal infarct, old No old tracing to compare Confirmed by Deno Etienne [phone]) on 03/08/2019 7:37:39 AM   Radiology Ct Head Wo Contrast  Result Date: 03/08/2019 CLINICAL DATA:  63 year old female with cough and altered mental status. EXAM: CT HEAD WITHOUT CONTRAST TECHNIQUE: Contiguous axial images were obtained from the base of the skull through the vertex without intravenous contrast. COMPARISON:  Brain MRI 08/17/2017. FINDINGS:  Brain: Stable cerebral volume since 2019. Disproportionate biparietal volume loss. No midline shift, mass effect, or evidence of intracranial mass lesion. No ventriculomegaly. No acute intracranial hemorrhage identified. No cortically based acute infarct identified. Gray-white matter differentiation is within normal limits for age. Vascular: Calcified atherosclerosis at the skull base. No suspicious intracranial vascular hyperdensity. Skull: No acute osseous abnormality identified. Sinuses/Orbits: Visualized paranasal sinuses and mastoids are stable and well pneumatized. Other: No acute orbit or scalp soft tissue findings. Nasal soft tissue implant redemonstrated. IMPRESSION: 1. No acute intracranial abnormality. 2. Chronic biparietal volume loss. Stable appearance of the brain from the 2019 MRI. Electronically Signed   By: Genevie Ann M.D.   On: 03/08/2019 08:35   Dg Chest Port 1 View  Result Date: 03/08/2019 CLINICAL DATA:  63 year old female with cough and altered mental status. EXAM: PORTABLE CHEST 1 VIEW COMPARISON:  None. FINDINGS: Portable AP semi upright view at 0803 hours. Lung volumes and mediastinal contours are within normal limits. Visualized tracheal air column is within normal limits. Allowing for portable technique the lungs are clear. No pneumothorax or pleural effusion. Negative visible bowel gas pattern. No acute osseous abnormality identified. IMPRESSION: Negative portable chest. Electronically Signed   By: Genevie Ann M.D.   On: 03/08/2019 08:32    Procedures Procedures (including critical care time)  Medications Ordered in ED Medications  levETIRAcetam (KEPPRA) IVPB 1000 mg/100 mL premix (0 mg Intravenous Stopped 03/08/19 1031)     Initial Impression / Assessment and Plan / ED Course  I have reviewed the triage vital signs and the nursing notes.  Pertinent labs & imaging results that were available during my care of  the patient were reviewed by me and considered in my medical  decision making (see chart for details).        63 yo F with a cc of seizure-like activity.  Reportedly tonic-clonic lasting for about 90 seconds to 2 minutes.  Patient had a postictal.  No loss of bowel or bladder.  Based on his history of a fall prior to the event will obtain a CT scan of the head to evaluate for intracranial pathology.  Lab work was done about a week ago but will repeat for significant electrolyte abnormality.  Will screen for infection with a UA and a chest x-ray.  Have family come and assess for baseline.  Family feels that the patient is at baseline.  Lab work with no significant electrolyte abnormality.  UA negative for infection.  Chest x-ray reviewed by me without focal infiltrate.  I discussed the case with Dr. Cheral Marker, neurology he recommended with her history of Alzheimer's starting her on Keppra.  Will be given a gram load here and 500 twice daily.  We will have her follow-up with her neurologist as an outpatient.  11:25 AM:  I have discussed the diagnosis/risks/treatment options with the patient and family and believe the pt to be eligible for discharge home to follow-up with Neuro. We also discussed returning to the ED immediately if new or worsening sx occur. We discussed the sx which are most concerning (e.g., sudden worsening pain, fever, inability to tolerate by mouth) that necessitate immediate return. Medications administered to the patient during their visit and any new prescriptions provided to the patient are listed below.  Medications given during this visit Medications  levETIRAcetam (KEPPRA) IVPB 1000 mg/100 mL premix (0 mg Intravenous Stopped 03/08/19 1031)     The patient appears reasonably screen and/or stabilized for discharge and I doubt any other medical condition or other [facility] requiring further screening, evaluation, or treatment in the ED at this time prior to discharge.    Final Clinical Impressions(s) / ED Diagnoses   Final diagnoses:   Seizure [facility])    ED Discharge Orders         Ordered    levETIRAcetam (KEPPRA) 500 MG tablet  2 times daily     03/08/19 [place], Quantavis Obryant, DO 03/08/19 1125

## 2019-03-08 NOTE — ED Notes (Signed)
ED Provider at bedside. Speaking with son

## 2019-03-08 NOTE — Discharge Instructions (Signed)
Take your medication as prescribed.  Follow up with your neurologist in the office

## 2019-03-08 NOTE — ED Triage Notes (Signed)
Pt arrived via [facility]. EMS reports a family member called for possible seizure activity. EMS states they did not witness and seizure like activity.
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
## 2019-03-16 ENCOUNTER — Other Ambulatory Visit: Payer: Self-pay

## 2019-03-16 ENCOUNTER — Ambulatory Visit (INDEPENDENT_AMBULATORY_CARE_PROVIDER_SITE_OTHER): Payer: Medicare Other | Admitting: Neurology

## 2019-03-16 ENCOUNTER — Encounter: Payer: Self-pay | Admitting: Neurology

## 2019-03-16 VITALS — Ht 59.0 in | Wt 100.0 lb

## 2019-03-16 DIAGNOSIS — G300 Alzheimer's disease with early onset: Secondary | ICD-10-CM | POA: Diagnosis not present

## 2019-03-16 DIAGNOSIS — F0280 Dementia in other diseases classified elsewhere without behavioral disturbance: Secondary | ICD-10-CM

## 2019-03-16 DIAGNOSIS — R569 Unspecified convulsions: Secondary | ICD-10-CM | POA: Diagnosis not present

## 2019-03-16 MED ORDER — LEVETIRACETAM 500 MG PO TABS: 500.0000 mg | ORAL_TABLET | Freq: Two times a day (BID) | ORAL | 1 refills | Status: DC

## 2019-04-11 ENCOUNTER — Ambulatory Visit: Payer: Medicare Other

## 2019-04-18 ENCOUNTER — Ambulatory Visit: Payer: Medicare Other

## 2019-04-26 ENCOUNTER — Ambulatory Visit: Payer: Medicare Other

## 2019-04-26 ENCOUNTER — Telehealth: Payer: Self-pay | Admitting: Physician Assistant

## 2019-04-26 DIAGNOSIS — R7989 Other specified abnormal findings of blood chemistry: Secondary | ICD-10-CM

## 2019-04-26 NOTE — Addendum Note (Signed)
Addended by: Leonidas Romberg on: 04/26/2019 04:47 PM   Modules accepted: Orders

## 2019-04-26 NOTE — Telephone Encounter (Signed)
Please contact the appropriate organization to get this set up for patient.

## 2019-04-26 NOTE — Telephone Encounter (Signed)
Called [facility] and left 2 messages to contact us for information to go out to the home for blood draw.

## 2019-04-26 NOTE — Telephone Encounter (Signed)
Pt's brother called in stating that his sister is having difficulty getting out of the house. He had to cancel her lab appt. Today. He wanted to know if they could get home health to come in to draw pt's blood? He also said that he is a Marine scientist and that he can drawn it. Please call the home #

## 2019-04-26 NOTE — Telephone Encounter (Signed)
Spoke with patient's brother Beverlyn Roux who is POA is agreeable with having the mobile phlebotomist come to the home to draw labs.

## 2019-04-27 NOTE — Telephone Encounter (Signed)
Left a detailed message on patient brother Beverlyn Roux POA vm that we have not spoken to the mobile phlebotomist. We could set up an video visit to discuss and have home health come to the house to draw labs. Please advise of recommendations

## 2019-05-03 ENCOUNTER — Encounter: Payer: Self-pay | Admitting: Physician Assistant

## 2019-05-03 ENCOUNTER — Other Ambulatory Visit: Payer: Self-pay

## 2019-05-03 ENCOUNTER — Ambulatory Visit (INDEPENDENT_AMBULATORY_CARE_PROVIDER_SITE_OTHER): Payer: Medicare Other | Admitting: Physician Assistant

## 2019-05-03 VITALS — BP 121/89 | HR 78

## 2019-05-03 DIAGNOSIS — R2681 Unsteadiness on feet: Secondary | ICD-10-CM | POA: Diagnosis not present

## 2019-05-03 DIAGNOSIS — F0280 Dementia in other diseases classified elsewhere without behavioral disturbance: Secondary | ICD-10-CM | POA: Diagnosis not present

## 2019-05-03 DIAGNOSIS — R531 Weakness: Secondary | ICD-10-CM

## 2019-05-03 DIAGNOSIS — G300 Alzheimer's disease with early onset: Secondary | ICD-10-CM | POA: Diagnosis not present

## 2019-05-03 NOTE — Progress Notes (Signed)
Virtual Visit via Video   I connected with patient on 05/03/19 at  1:00 PM EST by a video enabled telemedicine application and verified that I am speaking with the correct person using two identifiers.  Location patient: Home Location provider: [facility] Persons participating in the virtual visit: Patient, Brother/Caregiver (Ryenaldo), Provider, CMA (Patina Moore)  I discussed the limitations of evaluation and management by telemedicine and the availability of in person appointments. The patient expressed understanding and agreed to proceed.  Subjective:   HPI:   Patient presents with her brother/caregiver today via [email or web address] to discuss need for home health.  Would also like to discuss need for certain durable medical equipment.  Patient with history of early onset Alzheimer's, progressing swiftly.  Is reliant on her brother and his wife for full assist with all ADLs.  Patient is mostly nonverbal.  Patient's brother endorses she does have a rising, reclining chair and bedside commode.  They are having an issue with bathing outside of the bed.  Requesting shower chair/bench to help him.  He is looking into a portable shower, but unsure if insurance helps with this.  States that the patient is relatively unchanged since last visit with the exception of she does not want to walk.  They have been doing passive range of motion exercises with her twice daily to help slow down atrophy and prevent contractures.  She is eating well with assistance.  Denies any noted choking or aspiration.  They are able to keep her well-hydrated.  Patient is sleeping throughout the day.  Caregiver and wife do the best to turn her every 2 hours.  ROS:   See pertinent positives and negatives per HPI.  Patient Active Problem List   Diagnosis Date Noted  . Low back pain radiating to left leg 04/04/2017  . MGUS (monoclonal gammopathy of unknown significance) 03/23/2017  . Hyperlipidemia 10/29/2015  .  Colon cancer screening 10/29/2015  . Visit for preventive health examination 09/26/2014  . Breast cancer screening 09/26/2014  . Pap smear for cervical cancer screening 09/26/2014  . Alzheimer's disease, early onset ([place]) 05/28/2014  . Depression 11/14/2013    Social History   Tobacco Use  . Smoking status: Never Smoker  . Smokeless tobacco: Never Used  Substance Use Topics  . Alcohol use: No    Comment: rare    Current Outpatient Medications:  .  cetirizine (ZYRTEC) 10 MG tablet, Take 10 mg by mouth daily as needed for allergies., Disp: , Rfl:  .  diphenhydrAMINE (BENADRYL) 25 MG tablet, Take 25 mg by mouth every 6 (six) hours as needed for allergies., Disp: , Rfl:  .  escitalopram (LEXAPRO) 20 MG tablet, Take 1 tablet (20 mg total) by mouth daily., Disp: 90 tablet, Rfl: 3 .  gabapentin (NEURONTIN) 300 MG capsule, Take 2 capsules (600 mg total) by mouth 3 (three) times daily., Disp: 540 capsule, Rfl: 3 .  levETIRAcetam (KEPPRA) 500 MG tablet, Take 1 tablet (500 mg total) by mouth 2 (two) times daily., Disp: 180 tablet, Rfl: 1 .  magnesium hydroxide (MILK OF MAGNESIA) 400 MG/5ML suspension, Take 30 mLs by mouth daily as needed for mild constipation., Disp: , Rfl:  .  memantine (NAMENDA XR) 28 MG CP24 24 hr capsule, Take 1 capsule (28 mg total) by mouth daily., Disp: 90 capsule, Rfl: 3  No Known Allergies  Objective:   BP 121/89   Pulse 78   Patient is well-developed, well-nourished in no acute distress.  Resting comfortably  at home. Sleeping for some of the visit. Head is normocephalic, atraumatic.  No labored breathing.  Patient nonverbal. Patient is at baseline.   Assessment and Plan:   1. Early onset Alzheimer's dementia without behavioral disturbance ([place]) 2. Generalized weakness 3. Gait instability Dementia nearing end-stage.  No change from last visit.  Followed by neurology.  Patient is in very good hands with her brother and his wife, both being skilled RN.  They  have been encouraged to continue P ROM exercises twice daily, and turning patient every 2-3 hours.  They have motorized chair at home.  Discussed potential for semielectric hospital bed.  Caregiver declines at present but will consider.  We will send an order for DME shower chair and transfer bench.  Patient also due for repeat labs.  Unable to get her out of the house.  Will send home health out for skilled RN assessment, medical social work consult to help patient and family, and for lab draw.  Their goal is to take her to the [place] to be with family once travel [place] has ended.  This was her last clear wish. - Ambulatory referral to [facility] [place] 05/03/2019

## 2019-05-03 NOTE — Progress Notes (Signed)
I have discussed the procedure for the virtual visit with the patient who has given consent to proceed with assessment and treatment.   Alexandria Oliver, CMA     

## 2019-05-10 ENCOUNTER — Telehealth: Payer: Self-pay | Admitting: Physician Assistant

## 2019-05-10 DIAGNOSIS — F329 Major depressive disorder, single episode, unspecified: Secondary | ICD-10-CM | POA: Diagnosis not present

## 2019-05-10 DIAGNOSIS — Z9181 History of falling: Secondary | ICD-10-CM | POA: Diagnosis not present

## 2019-05-10 DIAGNOSIS — G300 Alzheimer's disease with early onset: Secondary | ICD-10-CM | POA: Diagnosis not present

## 2019-05-10 DIAGNOSIS — F0280 Dementia in other diseases classified elsewhere without behavioral disturbance: Secondary | ICD-10-CM | POA: Diagnosis not present

## 2019-05-10 DIAGNOSIS — E785 Hyperlipidemia, unspecified: Secondary | ICD-10-CM | POA: Diagnosis not present

## 2019-05-10 DIAGNOSIS — R569 Unspecified convulsions: Secondary | ICD-10-CM | POA: Diagnosis not present

## 2019-05-10 DIAGNOSIS — D472 Monoclonal gammopathy: Secondary | ICD-10-CM | POA: Diagnosis not present

## 2019-05-10 NOTE — Telephone Encounter (Signed)
Have you  heard anything about this?  

## 2019-05-10 NOTE — Telephone Encounter (Signed)
Copied from [place] [id number]. Topic: General - Other >> May 10, 2019  3:52 PM Keene Breath wrote: Reason for CRM: Called to request orders for Medication and disease management, PT evaluation.  CB# [phone]

## 2019-05-10 NOTE — Telephone Encounter (Signed)
Ok to give verbal orders. I had already sent in written referral/orders.

## 2019-05-11 DIAGNOSIS — R569 Unspecified convulsions: Secondary | ICD-10-CM | POA: Diagnosis not present

## 2019-05-11 DIAGNOSIS — F0280 Dementia in other diseases classified elsewhere without behavioral disturbance: Secondary | ICD-10-CM | POA: Diagnosis not present

## 2019-05-11 DIAGNOSIS — D472 Monoclonal gammopathy: Secondary | ICD-10-CM | POA: Diagnosis not present

## 2019-05-11 DIAGNOSIS — F329 Major depressive disorder, single episode, unspecified: Secondary | ICD-10-CM | POA: Diagnosis not present

## 2019-05-11 DIAGNOSIS — G300 Alzheimer's disease with early onset: Secondary | ICD-10-CM | POA: Diagnosis not present

## 2019-05-11 DIAGNOSIS — E785 Hyperlipidemia, unspecified: Secondary | ICD-10-CM | POA: Diagnosis not present

## 2019-05-11 NOTE — Telephone Encounter (Signed)
Verbal ok given.

## 2019-05-17 DIAGNOSIS — F329 Major depressive disorder, single episode, unspecified: Secondary | ICD-10-CM | POA: Diagnosis not present

## 2019-05-17 DIAGNOSIS — F0280 Dementia in other diseases classified elsewhere without behavioral disturbance: Secondary | ICD-10-CM | POA: Diagnosis not present

## 2019-05-17 DIAGNOSIS — R569 Unspecified convulsions: Secondary | ICD-10-CM | POA: Diagnosis not present

## 2019-05-17 DIAGNOSIS — D472 Monoclonal gammopathy: Secondary | ICD-10-CM | POA: Diagnosis not present

## 2019-05-17 DIAGNOSIS — G300 Alzheimer's disease with early onset: Secondary | ICD-10-CM | POA: Diagnosis not present

## 2019-05-17 DIAGNOSIS — E785 Hyperlipidemia, unspecified: Secondary | ICD-10-CM | POA: Diagnosis not present

## 2019-06-01 DIAGNOSIS — R569 Unspecified convulsions: Secondary | ICD-10-CM | POA: Diagnosis not present

## 2019-06-01 DIAGNOSIS — F0280 Dementia in other diseases classified elsewhere without behavioral disturbance: Secondary | ICD-10-CM | POA: Diagnosis not present

## 2019-06-01 DIAGNOSIS — F329 Major depressive disorder, single episode, unspecified: Secondary | ICD-10-CM | POA: Diagnosis not present

## 2019-06-01 DIAGNOSIS — D472 Monoclonal gammopathy: Secondary | ICD-10-CM | POA: Diagnosis not present

## 2019-06-01 DIAGNOSIS — E785 Hyperlipidemia, unspecified: Secondary | ICD-10-CM | POA: Diagnosis not present

## 2019-06-01 DIAGNOSIS — G300 Alzheimer's disease with early onset: Secondary | ICD-10-CM | POA: Diagnosis not present

## 2019-06-09 DIAGNOSIS — G300 Alzheimer's disease with early onset: Secondary | ICD-10-CM | POA: Diagnosis not present

## 2019-06-09 DIAGNOSIS — R569 Unspecified convulsions: Secondary | ICD-10-CM | POA: Diagnosis not present

## 2019-06-09 DIAGNOSIS — F0280 Dementia in other diseases classified elsewhere without behavioral disturbance: Secondary | ICD-10-CM | POA: Diagnosis not present

## 2019-06-09 DIAGNOSIS — F329 Major depressive disorder, single episode, unspecified: Secondary | ICD-10-CM | POA: Diagnosis not present

## 2019-06-09 DIAGNOSIS — E785 Hyperlipidemia, unspecified: Secondary | ICD-10-CM | POA: Diagnosis not present

## 2019-06-09 DIAGNOSIS — D472 Monoclonal gammopathy: Secondary | ICD-10-CM | POA: Diagnosis not present

## 2019-06-09 DIAGNOSIS — Z9181 History of falling: Secondary | ICD-10-CM | POA: Diagnosis not present

## 2019-06-12 ENCOUNTER — Encounter: Payer: Self-pay | Admitting: Neurology

## 2019-06-13 DIAGNOSIS — D472 Monoclonal gammopathy: Secondary | ICD-10-CM | POA: Diagnosis not present

## 2019-06-13 DIAGNOSIS — F0280 Dementia in other diseases classified elsewhere without behavioral disturbance: Secondary | ICD-10-CM | POA: Diagnosis not present

## 2019-06-13 DIAGNOSIS — R569 Unspecified convulsions: Secondary | ICD-10-CM | POA: Diagnosis not present

## 2019-06-13 DIAGNOSIS — F329 Major depressive disorder, single episode, unspecified: Secondary | ICD-10-CM | POA: Diagnosis not present

## 2019-06-13 DIAGNOSIS — G300 Alzheimer's disease with early onset: Secondary | ICD-10-CM | POA: Diagnosis not present

## 2019-06-13 DIAGNOSIS — E785 Hyperlipidemia, unspecified: Secondary | ICD-10-CM | POA: Diagnosis not present
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
## 2019-06-14 ENCOUNTER — Telehealth (INDEPENDENT_AMBULATORY_CARE_PROVIDER_SITE_OTHER): Payer: Medicare Other | Admitting: Neurology

## 2019-06-14 ENCOUNTER — Encounter: Payer: Self-pay | Admitting: Neurology

## 2019-06-14 ENCOUNTER — Other Ambulatory Visit: Payer: Self-pay

## 2019-06-14 ENCOUNTER — Telehealth: Payer: Self-pay | Admitting: Clinical

## 2019-06-14 VITALS — Ht 59.0 in | Wt 107.0 lb

## 2019-06-14 DIAGNOSIS — F0280 Dementia in other diseases classified elsewhere without behavioral disturbance: Secondary | ICD-10-CM | POA: Diagnosis not present

## 2019-06-14 DIAGNOSIS — R569 Unspecified convulsions: Secondary | ICD-10-CM | POA: Diagnosis not present

## 2019-06-14 DIAGNOSIS — G300 Alzheimer's disease with early onset: Secondary | ICD-10-CM | POA: Diagnosis not present

## 2019-06-14 NOTE — Telephone Encounter (Signed)
LCSW spoke with pt's brother Alexandria Oliver, provided information on how to complete Healthcare POA & DNR. Also provided information about respite services available, and local chapter of Alzheimer Association & their navigator program to assist families through the process of care, he provided consent for referral which I completed and faxed.

## 2019-06-15 DIAGNOSIS — G300 Alzheimer's disease with early onset: Secondary | ICD-10-CM | POA: Diagnosis not present

## 2019-06-15 DIAGNOSIS — R569 Unspecified convulsions: Secondary | ICD-10-CM | POA: Diagnosis not present

## 2019-06-15 DIAGNOSIS — E785 Hyperlipidemia, unspecified: Secondary | ICD-10-CM | POA: Diagnosis not present

## 2019-06-15 DIAGNOSIS — F329 Major depressive disorder, single episode, unspecified: Secondary | ICD-10-CM | POA: Diagnosis not present

## 2019-06-15 DIAGNOSIS — D472 Monoclonal gammopathy: Secondary | ICD-10-CM | POA: Diagnosis not present

## 2019-06-15 DIAGNOSIS — F0280 Dementia in other diseases classified elsewhere without behavioral disturbance: Secondary | ICD-10-CM | POA: Diagnosis not present

## 2019-06-20 DIAGNOSIS — F0280 Dementia in other diseases classified elsewhere without behavioral disturbance: Secondary | ICD-10-CM | POA: Diagnosis not present

## 2019-06-20 DIAGNOSIS — F329 Major depressive disorder, single episode, unspecified: Secondary | ICD-10-CM | POA: Diagnosis not present

## 2019-06-20 DIAGNOSIS — G300 Alzheimer's disease with early onset: Secondary | ICD-10-CM | POA: Diagnosis not present

## 2019-06-20 DIAGNOSIS — E785 Hyperlipidemia, unspecified: Secondary | ICD-10-CM | POA: Diagnosis not present

## 2019-06-20 DIAGNOSIS — D472 Monoclonal gammopathy: Secondary | ICD-10-CM | POA: Diagnosis not present

## 2019-06-20 DIAGNOSIS — R569 Unspecified convulsions: Secondary | ICD-10-CM | POA: Diagnosis not present

## 2019-06-22 DIAGNOSIS — F0280 Dementia in other diseases classified elsewhere without behavioral disturbance: Secondary | ICD-10-CM | POA: Diagnosis not present

## 2019-06-22 DIAGNOSIS — G300 Alzheimer's disease with early onset: Secondary | ICD-10-CM | POA: Diagnosis not present

## 2019-06-22 DIAGNOSIS — D472 Monoclonal gammopathy: Secondary | ICD-10-CM | POA: Diagnosis not present

## 2019-06-22 DIAGNOSIS — E785 Hyperlipidemia, unspecified: Secondary | ICD-10-CM | POA: Diagnosis not present

## 2019-06-22 DIAGNOSIS — R569 Unspecified convulsions: Secondary | ICD-10-CM | POA: Diagnosis not present

## 2019-06-22 DIAGNOSIS — F329 Major depressive disorder, single episode, unspecified: Secondary | ICD-10-CM | POA: Diagnosis not present

## 2019-09-05 ENCOUNTER — Other Ambulatory Visit: Payer: Self-pay | Admitting: Neurology

## 2019-10-06 IMAGING — MR MR HEAD W/O CM
10 series · 48 of 48 positions shown · non-contrast
Comparison: MRI head 11/08/2013

CLINICAL DATA: Early onset Alzheimer's dementia

EXAM:
MRI HEAD WITHOUT CONTRAST
TECHNIQUE: Multiplanar, multiecho pulse sequences of the brain and surrounding
structures were obtained without intravenous contrast.

[Series 2: T1 · sagittal · 5.0mm · 0.45mm/px · 2 of 21 slices shown]
[im 1/21]
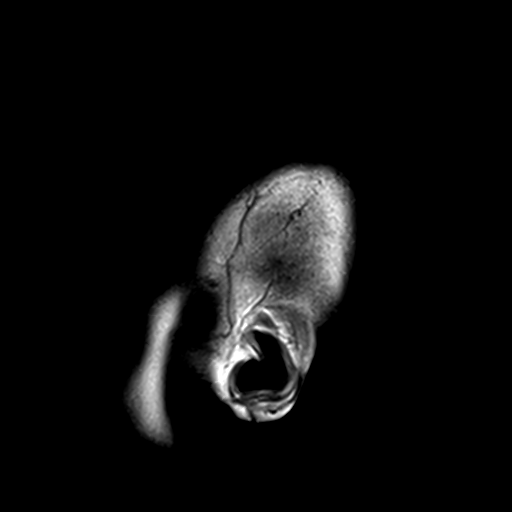
[im 21/21]
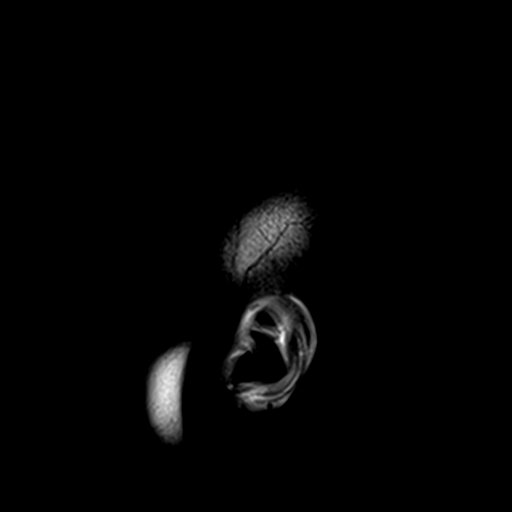

[Series 3: DWI · axial · 3.0mm · 1.80mm/px · z∈[-63,+81]mm · 9 of 100 slices shown (1 of 4)]
[im 1/100]
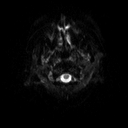
[im 13/100]
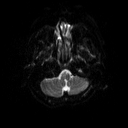
[im 25/100]
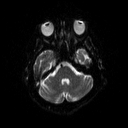
[im 38/100]
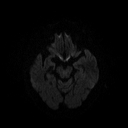
[im 50/100]
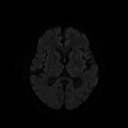
[im 62/100]
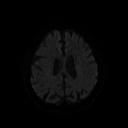
[im 75/100]
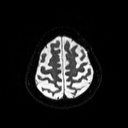
[im 87/100]
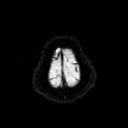
[im 100/100]
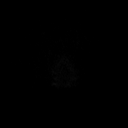

[Series 4: DWI · axial · 3.0mm · 1.80mm/px · z∈[-63,+81]mm · 5 of 50 slices shown (2 of 4)]
[im 1/50]
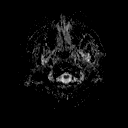
[im 13/50]
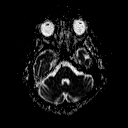
[im 25/50]
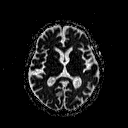
[im 37/50]
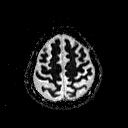
[im 50/50]
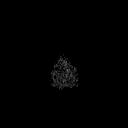

[Series 5: DWI · coronal · 5.0mm · 1.80mm/px · 6 of 68 slices shown (3 of 4)]
[im 1/68]
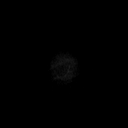
[im 14/68]
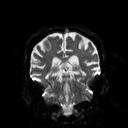
[im 27/68]
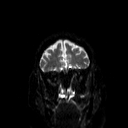
[im 41/68]
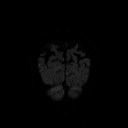
[im 54/68]
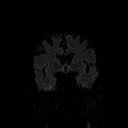
[im 68/68]
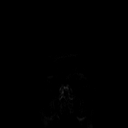

[Series 6: DWI · coronal · 5.0mm · 1.80mm/px · 3 of 34 slices shown (4 of 4)]
[im 1/34]
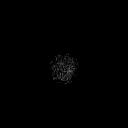
[im 17/34]
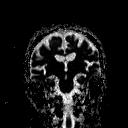
[im 34/34]
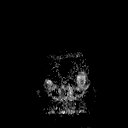

[Series 7: T2 · axial · 5.0mm · 0.51mm/px · z∈[-61,+83]mm · 2 of 22 slices shown (1 of 2)]
[im 1/22]
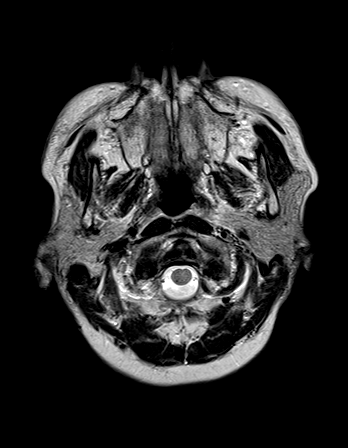
[im 22/22]
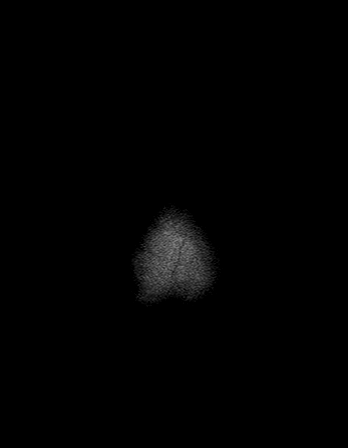

[Series 8: FLAIR · axial · 3.0mm · 0.45mm/px · z∈[-59,+74]mm · 3 of 30 slices shown]
[im 1/30]
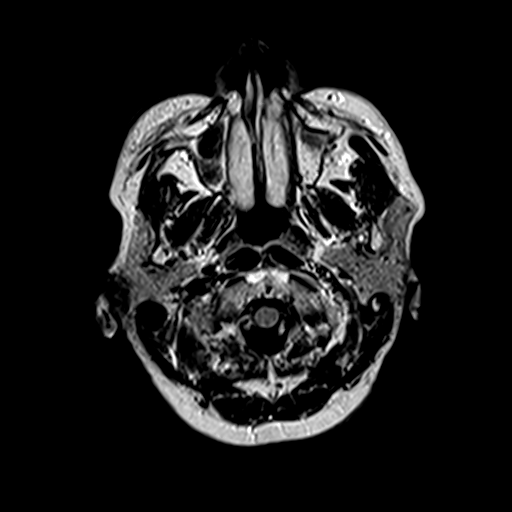
[im 15/30]
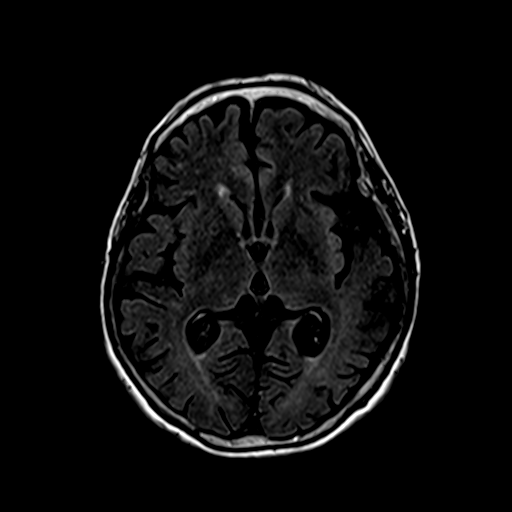
[im 30/30]
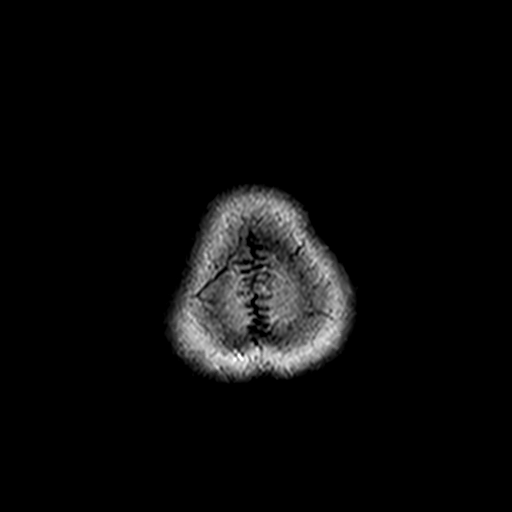

[Series 10: swi_images · axial · 5.0mm · 0.90mm/px · z∈[-62,+81]mm · 3 of 30 slices shown]
[im 1/30]
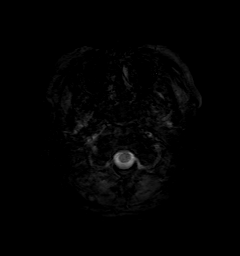
[im 15/30]
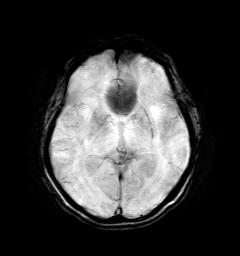
[im 30/30]
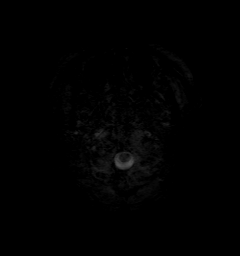

[Series 11: t1_mpr_tra · axial · 1.0mm · 0.71mm/px · z∈[-61,+80]mm · 13 of 144 slices shown]
[im 1/144]
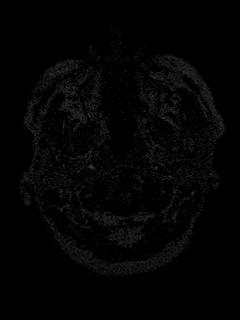
[im 12/144]
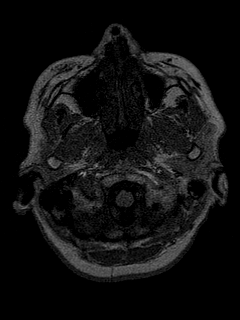
[im 24/144]
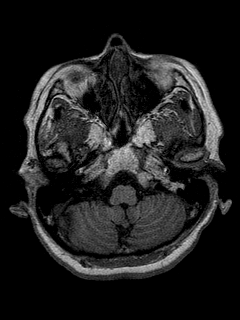
[im 36/144]
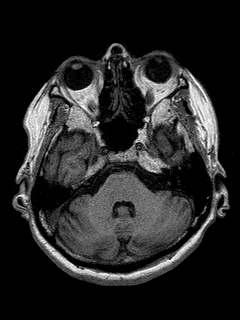
[im 48/144]
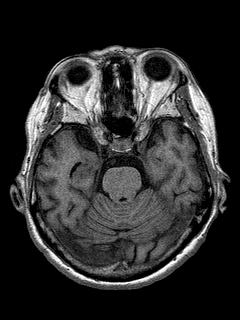
[im 60/144]
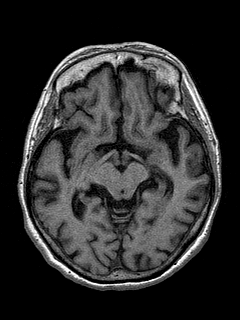
[im 72/144]
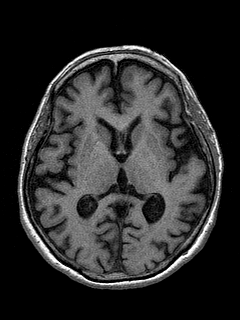
[im 84/144]
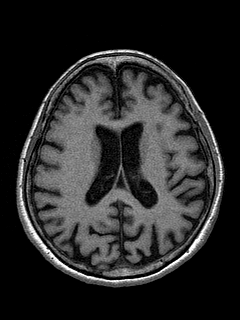
[im 96/144]
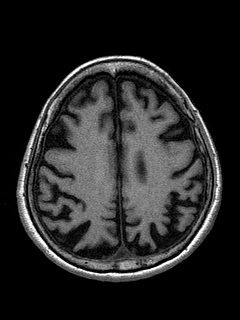
[im 108/144]
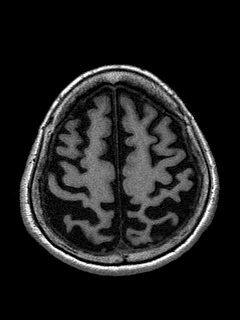
[im 120/144]
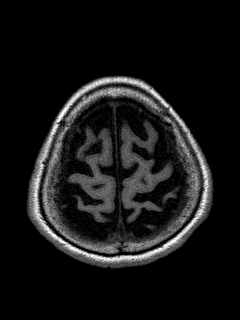
[im 132/144]
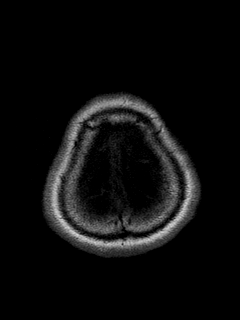
[im 144/144]
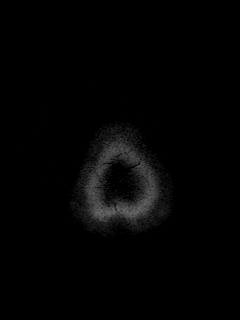

[Series 12: T2 · coronal · 5.0mm · 0.45mm/px · 2 of 25 slices shown (2 of 2)]
[im 1/25]
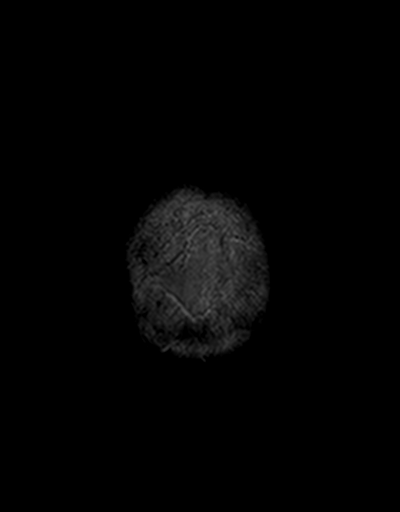
[im 25/25]
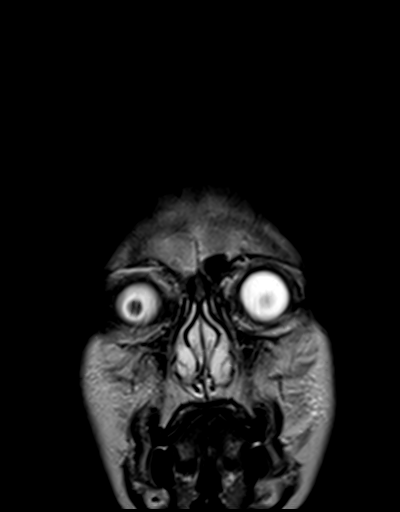

[48 of 48 positions shown; findings below may reference images not displayed]

FINDINGS: Brain: Progression of diffuse atrophy. Severe atrophy in the
parietal lobes also with progression.

Negative for acute infarct, hemorrhage, mass. No significant chronic
ischemia.

Vascular: Normal arterial flow voids

Skull and upper cervical spine: Negative

Sinuses/Orbits: Mild mucosal edema paranasal sinuses.  Normal orbit

Other: None
IMPRESSION: No acute abnormality. Progressive generalized atrophy with most
severe atrophy in the parietal lobes bilaterally.

## 2019-11-16 ENCOUNTER — Telehealth: Payer: Self-pay | Admitting: Neurology

## 2019-11-16 NOTE — Telephone Encounter (Signed)
Please advise 

## 2019-11-16 NOTE — Telephone Encounter (Signed)
Patient's brother Beverlyn Roux called in. He would like some advice. The patient has been becoming more and more agitated. She will make sounds continuously for multiple hours. She has trouble sleeping and gets angry for seemingly no reason and will shout angrily. He was not sure if this would be something a nerve medication would help with?

## 2019-11-17 ENCOUNTER — Other Ambulatory Visit: Payer: Self-pay

## 2019-11-17 MED ORDER — CITALOPRAM HYDROBROMIDE 10 MG PO TABS: 10.0000 mg | ORAL_TABLET | Freq: Every day | ORAL | 0 refills | Status: DC

## 2019-11-17 MED FILL — CITALOPRAM HBR 10 MG TABLET: 10 | 30 days supply | Qty: 30 | Fill #0

## 2019-11-17 NOTE — Telephone Encounter (Signed)
Pt brother advised of medication switch. As well as to call in 4 weeks to let us know how the pt is doing.

## 2019-11-17 NOTE — Telephone Encounter (Signed)
I would like to switch from escitalopram to citalopram.  Citalopram has been shown to help treat behavioral symptoms and agitation.  First decrease escitalopram to 10mg  daily for a week then stop.  Then begin citalopram 10mg  daily.

## 2019-12-28 ENCOUNTER — Telehealth: Payer: Self-pay | Admitting: Neurology

## 2019-12-28 MED ORDER — CITALOPRAM HYDROBROMIDE 20 MG PO TABS: 20.0000 mg | ORAL_TABLET | Freq: Every day | ORAL | 0 refills | Status: DC

## 2019-12-28 MED FILL — CITALOPRAM HBR 20 MG TABLET: 20 | 30 days supply | Qty: 30 | Fill #0

## 2019-12-28 NOTE — Telephone Encounter (Signed)
Spoke with pts brother who says citalopram has helped her shouting by about 20% but still making repetitive noises for often hours at a time, questioning continuing at current dose or alternatives, told him I'd pass question on to Dr Tomi Likens and call back with update, he verbalized understanding.

## 2019-12-28 NOTE — Telephone Encounter (Signed)
We can increase citalopram to 20mg  daily.

## 2019-12-28 NOTE — Telephone Encounter (Signed)
Patients brother called to give update on Citalopram. States it is helping some, but she is still shouting and making the noises. He is wanting to know if they should could continue to medication? Please call.

## 2019-12-28 NOTE — Telephone Encounter (Signed)
Spoke with brother and inst that can increase to citalopram 20 mg daily, new prescription sent. He verbalized understanding and will call after a few weeks if cont issues.

## 2020-01-31 ENCOUNTER — Encounter: Payer: Self-pay | Admitting: Physician Assistant

## 2020-01-31 ENCOUNTER — Ambulatory Visit (INDEPENDENT_AMBULATORY_CARE_PROVIDER_SITE_OTHER): Payer: Medicare Other | Admitting: Physician Assistant

## 2020-01-31 ENCOUNTER — Other Ambulatory Visit: Payer: Self-pay

## 2020-01-31 VITALS — BP 92/60 | HR 66 | Temp 97.4°F

## 2020-01-31 DIAGNOSIS — G300 Alzheimer's disease with early onset: Secondary | ICD-10-CM | POA: Diagnosis not present

## 2020-01-31 DIAGNOSIS — R627 Adult failure to thrive: Secondary | ICD-10-CM

## 2020-01-31 DIAGNOSIS — F0281 Dementia in other diseases classified elsewhere with behavioral disturbance: Secondary | ICD-10-CM

## 2020-01-31 MED ORDER — CITALOPRAM HYDROBROMIDE 20 MG PO TABS: 20.0000 mg | ORAL_TABLET | Freq: Every day | ORAL | 0 refills | Status: AC

## 2020-01-31 MED ORDER — CLONAZEPAM 0.5 MG PO TABS: 0.2500 mg | ORAL_TABLET | Freq: Three times a day (TID) | ORAL | 0 refills | Status: AC | PRN

## 2020-01-31 MED FILL — clonazePAM 0.5 MG TABS: 0.5 | 60 days supply | Qty: 90 | Fill #0

## 2020-01-31 MED FILL — CITALOPRAM HBR 20 MG TABLET: 20 | 30 days supply | Qty: 30 | Fill #0

## 2020-01-31 NOTE — Progress Notes (Signed)
Patient presents to clinic today with her brother (primary caregiver/POA) to discuss referral to hospice and DNR. 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 Patient is total care and now wheel-chair bound for mobility. Sometimes has clarity to follow a command but mostly "out of it". This has been a gradual deterioration since diagnosis 7 years ago.  She will eat if fed directly. Does not seek food or drink on her own. At night becomes very agitated and combative. Is still taking Namenda and Citalopram per Neurology.   Past Medical History:  Diagnosis Date   Acute confusion    Chicken pox    Depression     Current Outpatient Medications on File Prior to Visit  Medication Sig Dispense Refill   cetirizine (ZYRTEC) 10 MG tablet Take 10 mg by mouth daily as needed for allergies.     citalopram (CELEXA) 20 MG tablet Take 1 tablet (20 mg total) by mouth daily. 30 tablet 0   diphenhydrAMINE (BENADRYL) 25 MG tablet Take 25 mg by mouth every 6 (six) hours as needed for allergies.     gabapentin (NEURONTIN) 300 MG capsule Take 2 capsules (600 mg total) by mouth 3 (three) times daily. 540 capsule 3   levETIRAcetam (KEPPRA) 500 MG tablet TAKE 1 TABLET TWICE A DAY 180 tablet 3   magnesium hydroxide (MILK OF MAGNESIA) 400 MG/5ML suspension Take 30 mLs by mouth daily as needed for mild constipation.     memantine (NAMENDA XR) 28 MG CP24 24 hr capsule Take 1 capsule (28 mg total) by mouth daily. 90 capsule 3   No current facility-administered medications on file prior to visit.    No Known Allergies  Family History  Problem Relation Age of Onset   Diabetes Father        Deceased   Cancer Father 68       Multiple Myeloma   Heart defect Mother        Deceased   Asthma Sister    Heart defect Sister    Heart defect Brother    Migraines Other        Familial   Heart attack Sister    Healthy  Brother        x4   Healthy Sister        x2   Alzheimer's disease Maternal Grandfather        Dementia   Liver disease Maternal Uncle    Healthy Son        x1   Colon cancer Neg Hx    Esophageal cancer Neg Hx    Rectal cancer Neg Hx    Stomach cancer Neg Hx     Social History   Socioeconomic History   Marital status: Widowed    Spouse name: Not on file   Number of children: 1   Years of education: Not on file   Highest education level: Master's degree (e.g., MA, MS, MEng, MEd, MSW, MBA)  Occupational History   Not on file  Tobacco Use   Smoking status: Never Smoker   Smokeless tobacco: Never Used  Vaping Use   Vaping Use: Never used  Substance and Sexual Activity   Alcohol use: No    Comment: rare   Drug use: No   Sexual activity: Never  Other Topics Concern   Not on file  Social History Narrative   Caffeine - coffee 3-4 cups/week      Mostly drinks  Right handed      Lives with brother/family   Social Determinants of Health   Financial Resource Strain:    Difficulty of Paying Living Expenses: Not on file  Food Insecurity:    Worried About [place] in the Last Year: Not on file   Ran Out of Food in the Last Year: Not on file  Transportation Needs:    Lack of Transportation (Medical): Not on file   Lack of Transportation (Non-Medical): Not on file  Physical Activity:    Days of Exercise per Week: Not on file   Minutes of Exercise per Session: Not on file  Stress:    Feeling of Stress : Not on file  Social Connections:    Frequency of Communication with Friends and Family: Not on file   Frequency of Social Gatherings with Friends and Family: Not on file   Attends Religious Services: Not on file   Active Member of Clubs or Organizations: Not on file   Attends Archivist Meetings: Not on file   Marital Status: Not on file   Review of Systems - See HPI.  All other ROS are negative.  BP 92/60     Pulse 66    Temp (!) 97.4 F (36.3 C)    SpO2 94%   Physical Exam Constitutional:      General: She is not in acute distress.    Appearance: Normal appearance. She is not ill-appearing.  HENT:     Head: Normocephalic and atraumatic.  Eyes:     Conjunctiva/sclera: Conjunctivae normal.  Cardiovascular:     Rate and Rhythm: Normal rate and regular rhythm.     Pulses: Normal pulses.     Heart sounds: Normal heart sounds.  Pulmonary:     Effort: Pulmonary effort is normal.     Breath sounds: Normal breath sounds.  Neurological:     General: No focal deficit present.     Mental Status: She is alert.     Comments: Alert but not oriented.  Makes grunting noises and laughing but no verbal communication.    Non-combative during interview or exam but does become somewhat restless/agitated near end of visit.   Assessment/Plan: 1. Early onset Alzheimer's disease with behavioral disturbance ([place]) 2. Failure to thrive in adult Getting to end-stage disease. No verbal communication. Agitation, worse at night likely 2/2 sundowning. Will have them continue medications per Neurology. Will add on low-dose Clonazepam as directed for agitation. DNR completed toady. Will be signed into chart. Also POA to be uploaded into new [employer] system from media portion of EMR for easier access and viewing. Referral to Hospice placed.  - Ambulatory referral to Hospice  This visit occurred during the SARS-CoV-2 public health emergency.  Safety protocols were in place, including screening questions prior to the visit, additional usage of staff PPE, and extensive cleaning of exam room while observing appropriate contact time as indicated for disinfecting solutions.     Leeanne Rio, PA-C

## 2020-01-31 NOTE — Patient Instructions (Signed)
I have placed referral to Hospice for you.  You should hear from them within a few days.  Continue Citalopram as directed -- a prescription has been sent in.  I am starting Clonazepam (1/2 tablet) every 6-8 hours as needed for agitation.   I have put the DNR on file in Creasie's chart.   Please let me know if there is any other way I can be of assistance to you through this difficult time.

## 2020-02-01 ENCOUNTER — Telehealth: Payer: Self-pay | Admitting: Physician Assistant

## 2020-02-01 NOTE — Telephone Encounter (Signed)
Please advise 

## 2020-02-01 NOTE — Telephone Encounter (Signed)
[facility] would like to know if Alexandria Oliver would be willing to serve as Mrs. Alexandria Oliver hospice attending:   They said that he could be the attending even though he is a PA - they would just need to know his supervising physician?  They will call back on 02/02/2020 to check again.  I did not get the phone number but can let you know when they call back.

## 2020-02-02 NOTE — Telephone Encounter (Signed)
That is fine with me. Supervising MD, Dr. Annye Asa.

## 2020-02-02 NOTE — Telephone Encounter (Signed)
Spoke with Latoya at [facility] and gave the ok for Raiford Noble, PA-C to attend and Supervisor MD Annye Asa

## 2020-02-07 DIAGNOSIS — G319 Degenerative disease of nervous system, unspecified: Secondary | ICD-10-CM | POA: Diagnosis not present

## 2020-02-07 DIAGNOSIS — F329 Major depressive disorder, single episode, unspecified: Secondary | ICD-10-CM | POA: Diagnosis not present

## 2020-02-07 DIAGNOSIS — G300 Alzheimer's disease with early onset: Secondary | ICD-10-CM | POA: Diagnosis not present

## 2020-02-07 DIAGNOSIS — Z6821 Body mass index (BMI) 21.0-21.9, adult: Secondary | ICD-10-CM | POA: Diagnosis not present

## 2020-02-07 DIAGNOSIS — R569 Unspecified convulsions: Secondary | ICD-10-CM | POA: Diagnosis not present

## 2020-02-07 DIAGNOSIS — J302 Other seasonal allergic rhinitis: Secondary | ICD-10-CM | POA: Diagnosis not present

## 2020-02-07 DIAGNOSIS — R1310 Dysphagia, unspecified: Secondary | ICD-10-CM | POA: Diagnosis not present

## 2020-02-07 DIAGNOSIS — F0280 Dementia in other diseases classified elsewhere without behavioral disturbance: Secondary | ICD-10-CM | POA: Diagnosis not present

## 2020-02-08 ENCOUNTER — Telehealth: Payer: Self-pay | Admitting: Physician Assistant

## 2020-02-08 DIAGNOSIS — G319 Degenerative disease of nervous system, unspecified: Secondary | ICD-10-CM | POA: Diagnosis not present

## 2020-02-08 DIAGNOSIS — R1310 Dysphagia, unspecified: Secondary | ICD-10-CM | POA: Diagnosis not present

## 2020-02-08 DIAGNOSIS — R569 Unspecified convulsions: Secondary | ICD-10-CM | POA: Diagnosis not present

## 2020-02-08 DIAGNOSIS — G300 Alzheimer's disease with early onset: Secondary | ICD-10-CM | POA: Diagnosis not present

## 2020-02-08 DIAGNOSIS — F329 Major depressive disorder, single episode, unspecified: Secondary | ICD-10-CM | POA: Diagnosis not present

## 2020-02-08 DIAGNOSIS — F0280 Dementia in other diseases classified elsewhere without behavioral disturbance: Secondary | ICD-10-CM | POA: Diagnosis not present

## 2020-02-08 NOTE — Telephone Encounter (Signed)
Placed in provider's bin in the back office for completion.

## 2020-02-08 NOTE — Telephone Encounter (Signed)
Hospice Order form placed in Cody's bin up front

## 2020-02-09 DIAGNOSIS — F329 Major depressive disorder, single episode, unspecified: Secondary | ICD-10-CM | POA: Diagnosis not present

## 2020-02-09 DIAGNOSIS — F0280 Dementia in other diseases classified elsewhere without behavioral disturbance: Secondary | ICD-10-CM | POA: Diagnosis not present

## 2020-02-09 DIAGNOSIS — G319 Degenerative disease of nervous system, unspecified: Secondary | ICD-10-CM | POA: Diagnosis not present

## 2020-02-09 DIAGNOSIS — R569 Unspecified convulsions: Secondary | ICD-10-CM | POA: Diagnosis not present

## 2020-02-09 DIAGNOSIS — R1310 Dysphagia, unspecified: Secondary | ICD-10-CM | POA: Diagnosis not present

## 2020-02-09 DIAGNOSIS — G300 Alzheimer's disease with early onset: Secondary | ICD-10-CM | POA: Diagnosis not present

## 2020-02-13 DIAGNOSIS — R1310 Dysphagia, unspecified: Secondary | ICD-10-CM | POA: Diagnosis not present

## 2020-02-13 DIAGNOSIS — G319 Degenerative disease of nervous system, unspecified: Secondary | ICD-10-CM | POA: Diagnosis not present

## 2020-02-13 DIAGNOSIS — R569 Unspecified convulsions: Secondary | ICD-10-CM | POA: Diagnosis not present

## 2020-02-13 DIAGNOSIS — G300 Alzheimer's disease with early onset: Secondary | ICD-10-CM | POA: Diagnosis not present

## 2020-02-13 DIAGNOSIS — F329 Major depressive disorder, single episode, unspecified: Secondary | ICD-10-CM | POA: Diagnosis not present

## 2020-02-13 DIAGNOSIS — F0280 Dementia in other diseases classified elsewhere without behavioral disturbance: Secondary | ICD-10-CM | POA: Diagnosis not present

## 2020-02-13 NOTE — Telephone Encounter (Signed)
Forms faxed to [facility] care and copy given for charge

## 2020-02-13 NOTE — Telephone Encounter (Signed)
Forms completed. Ready for fax. Charge sheet to be given to front desk staff to send to insurance.

## 2020-02-14 DIAGNOSIS — R569 Unspecified convulsions: Secondary | ICD-10-CM | POA: Diagnosis not present

## 2020-02-14 DIAGNOSIS — R1310 Dysphagia, unspecified: Secondary | ICD-10-CM | POA: Diagnosis not present

## 2020-02-14 DIAGNOSIS — G300 Alzheimer's disease with early onset: Secondary | ICD-10-CM | POA: Diagnosis not present

## 2020-02-14 DIAGNOSIS — F0280 Dementia in other diseases classified elsewhere without behavioral disturbance: Secondary | ICD-10-CM | POA: Diagnosis not present

## 2020-02-14 DIAGNOSIS — G319 Degenerative disease of nervous system, unspecified: Secondary | ICD-10-CM | POA: Diagnosis not present

## 2020-02-14 DIAGNOSIS — F329 Major depressive disorder, single episode, unspecified: Secondary | ICD-10-CM | POA: Diagnosis not present

## 2020-02-16 DIAGNOSIS — F32A Depression, unspecified: Secondary | ICD-10-CM | POA: Diagnosis not present

## 2020-02-16 DIAGNOSIS — F0280 Dementia in other diseases classified elsewhere without behavioral disturbance: Secondary | ICD-10-CM | POA: Diagnosis not present

## 2020-02-16 DIAGNOSIS — G319 Degenerative disease of nervous system, unspecified: Secondary | ICD-10-CM | POA: Diagnosis not present

## 2020-02-16 DIAGNOSIS — J302 Other seasonal allergic rhinitis: Secondary | ICD-10-CM | POA: Diagnosis not present

## 2020-02-16 DIAGNOSIS — R569 Unspecified convulsions: Secondary | ICD-10-CM | POA: Diagnosis not present

## 2020-02-16 DIAGNOSIS — Z6821 Body mass index (BMI) 21.0-21.9, adult: Secondary | ICD-10-CM | POA: Diagnosis not present

## 2020-02-16 DIAGNOSIS — G300 Alzheimer's disease with early onset: Secondary | ICD-10-CM | POA: Diagnosis not present

## 2020-02-16 DIAGNOSIS — R1310 Dysphagia, unspecified: Secondary | ICD-10-CM | POA: Diagnosis not present

## 2020-02-19 DIAGNOSIS — G300 Alzheimer's disease with early onset: Secondary | ICD-10-CM | POA: Diagnosis not present

## 2020-02-19 DIAGNOSIS — R569 Unspecified convulsions: Secondary | ICD-10-CM | POA: Diagnosis not present

## 2020-02-19 DIAGNOSIS — R1310 Dysphagia, unspecified: Secondary | ICD-10-CM | POA: Diagnosis not present

## 2020-02-19 DIAGNOSIS — F32A Depression, unspecified: Secondary | ICD-10-CM | POA: Diagnosis not present

## 2020-02-19 DIAGNOSIS — G319 Degenerative disease of nervous system, unspecified: Secondary | ICD-10-CM | POA: Diagnosis not present

## 2020-02-19 DIAGNOSIS — F0280 Dementia in other diseases classified elsewhere without behavioral disturbance: Secondary | ICD-10-CM | POA: Diagnosis not present

## 2020-02-21 DIAGNOSIS — R1310 Dysphagia, unspecified: Secondary | ICD-10-CM | POA: Diagnosis not present

## 2020-02-21 DIAGNOSIS — R569 Unspecified convulsions: Secondary | ICD-10-CM | POA: Diagnosis not present

## 2020-02-21 DIAGNOSIS — G319 Degenerative disease of nervous system, unspecified: Secondary | ICD-10-CM | POA: Diagnosis not present

## 2020-02-21 DIAGNOSIS — F32A Depression, unspecified: Secondary | ICD-10-CM | POA: Diagnosis not present

## 2020-02-21 DIAGNOSIS — G300 Alzheimer's disease with early onset: Secondary | ICD-10-CM | POA: Diagnosis not present

## 2020-02-21 DIAGNOSIS — F0280 Dementia in other diseases classified elsewhere without behavioral disturbance: Secondary | ICD-10-CM | POA: Diagnosis not present

## 2020-02-23 DIAGNOSIS — R569 Unspecified convulsions: Secondary | ICD-10-CM | POA: Diagnosis not present

## 2020-02-23 DIAGNOSIS — F0280 Dementia in other diseases classified elsewhere without behavioral disturbance: Secondary | ICD-10-CM | POA: Diagnosis not present

## 2020-02-23 DIAGNOSIS — G300 Alzheimer's disease with early onset: Secondary | ICD-10-CM | POA: Diagnosis not present

## 2020-02-23 DIAGNOSIS — F32A Depression, unspecified: Secondary | ICD-10-CM | POA: Diagnosis not present

## 2020-02-23 DIAGNOSIS — R1310 Dysphagia, unspecified: Secondary | ICD-10-CM | POA: Diagnosis not present

## 2020-02-23 DIAGNOSIS — G319 Degenerative disease of nervous system, unspecified: Secondary | ICD-10-CM | POA: Diagnosis not present

## 2020-02-26 DIAGNOSIS — F32A Depression, unspecified: Secondary | ICD-10-CM | POA: Diagnosis not present

## 2020-02-26 DIAGNOSIS — G300 Alzheimer's disease with early onset: Secondary | ICD-10-CM | POA: Diagnosis not present

## 2020-02-26 DIAGNOSIS — F0280 Dementia in other diseases classified elsewhere without behavioral disturbance: Secondary | ICD-10-CM | POA: Diagnosis not present

## 2020-02-26 DIAGNOSIS — R1310 Dysphagia, unspecified: Secondary | ICD-10-CM | POA: Diagnosis not present

## 2020-02-26 DIAGNOSIS — R569 Unspecified convulsions: Secondary | ICD-10-CM | POA: Diagnosis not present

## 2020-02-26 DIAGNOSIS — G319 Degenerative disease of nervous system, unspecified: Secondary | ICD-10-CM | POA: Diagnosis not present
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
## 2020-02-28 ENCOUNTER — Ambulatory Visit: Payer: Medicare Other | Admitting: Neurology

## 2020-02-28 DIAGNOSIS — F32A Depression, unspecified: Secondary | ICD-10-CM | POA: Diagnosis not present

## 2020-02-28 DIAGNOSIS — G319 Degenerative disease of nervous system, unspecified: Secondary | ICD-10-CM | POA: Diagnosis not present

## 2020-02-28 DIAGNOSIS — R569 Unspecified convulsions: Secondary | ICD-10-CM | POA: Diagnosis not present

## 2020-02-28 DIAGNOSIS — F0280 Dementia in other diseases classified elsewhere without behavioral disturbance: Secondary | ICD-10-CM | POA: Diagnosis not present

## 2020-02-28 DIAGNOSIS — R1310 Dysphagia, unspecified: Secondary | ICD-10-CM | POA: Diagnosis not present

## 2020-02-28 DIAGNOSIS — G300 Alzheimer's disease with early onset: Secondary | ICD-10-CM | POA: Diagnosis not present

## 2020-02-28 MED FILL — HALOPERIDOL 5 MG TABS: 5 | 10 days supply | Qty: 60 | Fill #0

## 2020-02-28 MED FILL — BISACODYL 10 MG SUPP: 10 | 25 days supply | Qty: 50 | Fill #0

## 2020-02-28 MED FILL — CITALOPRAM HBR 20 MG TABLET: 20 | 30 days supply | Qty: 30 | Fill #0

## 2020-02-29 DIAGNOSIS — R569 Unspecified convulsions: Secondary | ICD-10-CM | POA: Diagnosis not present

## 2020-02-29 DIAGNOSIS — R1310 Dysphagia, unspecified: Secondary | ICD-10-CM | POA: Diagnosis not present

## 2020-02-29 DIAGNOSIS — G319 Degenerative disease of nervous system, unspecified: Secondary | ICD-10-CM | POA: Diagnosis not present

## 2020-02-29 DIAGNOSIS — F0280 Dementia in other diseases classified elsewhere without behavioral disturbance: Secondary | ICD-10-CM | POA: Diagnosis not present

## 2020-02-29 DIAGNOSIS — G300 Alzheimer's disease with early onset: Secondary | ICD-10-CM | POA: Diagnosis not present

## 2020-02-29 DIAGNOSIS — F32A Depression, unspecified: Secondary | ICD-10-CM | POA: Diagnosis not present

## 2020-02-29 MED FILL — ANASPAZ 0.125 MG TABLET ODT: 0.125 | 10 days supply | Qty: 60 | Fill #0

## 2020-03-01 ENCOUNTER — Telehealth: Payer: Self-pay | Admitting: Physician Assistant

## 2020-03-01 DIAGNOSIS — R1310 Dysphagia, unspecified: Secondary | ICD-10-CM | POA: Diagnosis not present

## 2020-03-01 DIAGNOSIS — F0280 Dementia in other diseases classified elsewhere without behavioral disturbance: Secondary | ICD-10-CM | POA: Diagnosis not present

## 2020-03-01 DIAGNOSIS — G300 Alzheimer's disease with early onset: Secondary | ICD-10-CM | POA: Diagnosis not present

## 2020-03-01 DIAGNOSIS — R569 Unspecified convulsions: Secondary | ICD-10-CM | POA: Diagnosis not present

## 2020-03-01 DIAGNOSIS — F32A Depression, unspecified: Secondary | ICD-10-CM | POA: Diagnosis not present

## 2020-03-01 DIAGNOSIS — G319 Degenerative disease of nervous system, unspecified: Secondary | ICD-10-CM | POA: Diagnosis not present

## 2020-03-01 NOTE — Telephone Encounter (Signed)
Pt's brother called in asking for a letter for medical exemption from covid quarantine due to her decline in her medical health condition. He is trying to take her back to the [place]. He would need this letter to cover him and her as he is her only caregiver. They both have received their covid vaccinations. She is currently with hospice care and they are not excepting her to live long. Please advise ASAP and call  Reynaldo with any questions you may have.

## 2020-03-04 DIAGNOSIS — F32A Depression, unspecified: Secondary | ICD-10-CM | POA: Diagnosis not present

## 2020-03-04 DIAGNOSIS — F0280 Dementia in other diseases classified elsewhere without behavioral disturbance: Secondary | ICD-10-CM | POA: Diagnosis not present

## 2020-03-04 DIAGNOSIS — G319 Degenerative disease of nervous system, unspecified: Secondary | ICD-10-CM | POA: Diagnosis not present

## 2020-03-04 DIAGNOSIS — R569 Unspecified convulsions: Secondary | ICD-10-CM | POA: Diagnosis not present

## 2020-03-04 DIAGNOSIS — G300 Alzheimer's disease with early onset: Secondary | ICD-10-CM | POA: Diagnosis not present

## 2020-03-04 DIAGNOSIS — R1310 Dysphagia, unspecified: Secondary | ICD-10-CM | POA: Diagnosis not present

## 2020-03-04 MED FILL — MORPHINE SULF 100 MG/5 ML S: 100 | 10 days supply | Qty: 15 | Fill #0

## 2020-03-05 ENCOUNTER — Telehealth: Payer: Self-pay | Admitting: Physician Assistant

## 2020-03-05 DIAGNOSIS — F32A Depression, unspecified: Secondary | ICD-10-CM | POA: Diagnosis not present

## 2020-03-05 DIAGNOSIS — F0280 Dementia in other diseases classified elsewhere without behavioral disturbance: Secondary | ICD-10-CM | POA: Diagnosis not present

## 2020-03-05 DIAGNOSIS — R1310 Dysphagia, unspecified: Secondary | ICD-10-CM | POA: Diagnosis not present

## 2020-03-05 DIAGNOSIS — G300 Alzheimer's disease with early onset: Secondary | ICD-10-CM | POA: Diagnosis not present

## 2020-03-05 DIAGNOSIS — G319 Degenerative disease of nervous system, unspecified: Secondary | ICD-10-CM | POA: Diagnosis not present

## 2020-03-05 DIAGNOSIS — R569 Unspecified convulsions: Secondary | ICD-10-CM | POA: Diagnosis not present

## 2020-03-12 ENCOUNTER — Telehealth: Payer: Self-pay | Admitting: Physician Assistant

## 2020-03-12 NOTE — Telephone Encounter (Signed)
Death Certificate placed in Cody's bin up front

## 2020-03-12 NOTE — Telephone Encounter (Signed)
Death Certificate in your folder for review and completion

## 2020-03-13 NOTE — Telephone Encounter (Signed)
Formal Certificate completed and mailed in via envelope provided by [facility] home.

## 2020-03-18 NOTE — Telephone Encounter (Signed)
Forms completed. Ready to fax.

## 2020-03-18 NOTE — Telephone Encounter (Signed)
Form placed in Cody's bin up front

## 2020-03-18 NOTE — Telephone Encounter (Signed)
Spoke quickly with patient's brother. Alexandria Oliver passed a few moments before phone call. Gave him my condolences with plan to check up on him and family in a day or so and see what support we can offer during this time.

## 2020-03-18 NOTE — Telephone Encounter (Signed)
Forms faxed

## 2020-03-18 NOTE — Telephone Encounter (Signed)
Please advise 

## 2020-03-18 DEATH — deceased
